# Patient Record
Sex: Female | Born: 1955 | Race: White | Hispanic: No | Marital: Married | Smoking: Never smoker
Health system: Southern US, Community
[De-identification: ages and names within clinical notes are randomized; demographics above are authoritative.]

## PROBLEM LIST (undated history)

## (undated) DIAGNOSIS — F419 Anxiety disorder, unspecified: Secondary | ICD-10-CM

## (undated) DIAGNOSIS — R269 Unspecified abnormalities of gait and mobility: Secondary | ICD-10-CM

## (undated) DIAGNOSIS — T8859XA Other complications of anesthesia, initial encounter: Secondary | ICD-10-CM

## (undated) DIAGNOSIS — E7800 Pure hypercholesterolemia, unspecified: Secondary | ICD-10-CM

## (undated) DIAGNOSIS — T4145XA Adverse effect of unspecified anesthetic, initial encounter: Secondary | ICD-10-CM

## (undated) DIAGNOSIS — F0280 Dementia in other diseases classified elsewhere without behavioral disturbance: Secondary | ICD-10-CM

## (undated) DIAGNOSIS — G3101 Pick's disease: Principal | ICD-10-CM

## (undated) DIAGNOSIS — K5792 Diverticulitis of intestine, part unspecified, without perforation or abscess without bleeding: Secondary | ICD-10-CM

## (undated) DIAGNOSIS — C801 Malignant (primary) neoplasm, unspecified: Secondary | ICD-10-CM

## (undated) HISTORY — PX: COLONOSCOPY: SHX174

## (undated) HISTORY — DX: Unspecified abnormalities of gait and mobility: R26.9

## (undated) HISTORY — DX: Pick's disease: G31.01

## (undated) HISTORY — DX: Dementia in other diseases classified elsewhere, unspecified severity, without behavioral disturbance, psychotic disturbance, mood disturbance, and anxiety: F02.80

## (undated) HISTORY — PX: BREAST BIOPSY: SHX20

## (undated) HISTORY — DX: Pure hypercholesterolemia, unspecified: E78.00

## (undated) HISTORY — DX: Malignant (primary) neoplasm, unspecified: C80.1

---

## 1985-12-25 DIAGNOSIS — C801 Malignant (primary) neoplasm, unspecified: Secondary | ICD-10-CM

## 1985-12-25 HISTORY — PX: BREAST LUMPECTOMY: SHX2

## 1985-12-25 HISTORY — DX: Malignant (primary) neoplasm, unspecified: C80.1

## 2000-03-22 ENCOUNTER — Encounter: Payer: Self-pay | Admitting: *Deleted

## 2000-03-22 ENCOUNTER — Encounter: Admission: RE | Admit: 2000-03-22 | Discharge: 2000-03-22 | Payer: Self-pay | Admitting: *Deleted

## 2000-09-04 ENCOUNTER — Emergency Department (HOSPITAL_COMMUNITY): Admission: EM | Admit: 2000-09-04 | Discharge: 2000-09-04 | Payer: Self-pay | Admitting: Emergency Medicine

## 2002-02-12 ENCOUNTER — Other Ambulatory Visit: Admission: RE | Admit: 2002-02-12 | Discharge: 2002-02-12 | Payer: Self-pay | Admitting: Internal Medicine

## 2002-03-26 ENCOUNTER — Encounter: Admission: RE | Admit: 2002-03-26 | Discharge: 2002-03-26 | Payer: Self-pay | Admitting: Internal Medicine

## 2002-03-26 ENCOUNTER — Encounter: Payer: Self-pay | Admitting: Internal Medicine

## 2002-05-09 ENCOUNTER — Encounter: Admission: RE | Admit: 2002-05-09 | Discharge: 2002-05-09 | Payer: Self-pay | Admitting: Internal Medicine

## 2002-05-09 ENCOUNTER — Encounter: Payer: Self-pay | Admitting: Internal Medicine

## 2003-05-11 ENCOUNTER — Encounter: Payer: Self-pay | Admitting: Internal Medicine

## 2003-05-11 ENCOUNTER — Encounter: Admission: RE | Admit: 2003-05-11 | Discharge: 2003-05-11 | Payer: Self-pay | Admitting: Internal Medicine

## 2003-05-18 ENCOUNTER — Other Ambulatory Visit: Admission: RE | Admit: 2003-05-18 | Discharge: 2003-05-18 | Payer: Self-pay | Admitting: Obstetrics & Gynecology

## 2005-03-20 ENCOUNTER — Encounter: Admission: RE | Admit: 2005-03-20 | Discharge: 2005-03-20 | Payer: Self-pay | Admitting: Obstetrics & Gynecology

## 2006-06-11 ENCOUNTER — Encounter: Admission: RE | Admit: 2006-06-11 | Discharge: 2006-06-11 | Payer: Self-pay | Admitting: Internal Medicine

## 2006-06-20 ENCOUNTER — Encounter: Admission: RE | Admit: 2006-06-20 | Discharge: 2006-06-20 | Payer: Self-pay | Admitting: Internal Medicine

## 2006-12-31 ENCOUNTER — Encounter: Admission: RE | Admit: 2006-12-31 | Discharge: 2006-12-31 | Payer: Self-pay | Admitting: Obstetrics & Gynecology

## 2007-07-10 ENCOUNTER — Encounter: Admission: RE | Admit: 2007-07-10 | Discharge: 2007-07-10 | Payer: Self-pay | Admitting: Internal Medicine

## 2008-09-23 ENCOUNTER — Encounter: Admission: RE | Admit: 2008-09-23 | Discharge: 2008-09-23 | Payer: Self-pay | Admitting: Internal Medicine

## 2009-08-21 ENCOUNTER — Emergency Department (HOSPITAL_COMMUNITY): Admission: EM | Admit: 2009-08-21 | Discharge: 2009-08-21 | Payer: Self-pay | Admitting: Emergency Medicine

## 2009-10-19 ENCOUNTER — Encounter: Admission: RE | Admit: 2009-10-19 | Discharge: 2009-10-19 | Payer: Self-pay | Admitting: Internal Medicine

## 2010-09-23 ENCOUNTER — Ambulatory Visit: Payer: Self-pay | Admitting: Genetic Counselor

## 2010-10-26 ENCOUNTER — Encounter: Admission: RE | Admit: 2010-10-26 | Discharge: 2010-10-26 | Payer: Self-pay | Admitting: Obstetrics & Gynecology

## 2011-01-15 ENCOUNTER — Encounter: Payer: Self-pay | Admitting: Obstetrics & Gynecology

## 2011-01-15 ENCOUNTER — Encounter: Payer: Self-pay | Admitting: Internal Medicine

## 2012-02-12 ENCOUNTER — Other Ambulatory Visit: Payer: Self-pay | Admitting: Obstetrics & Gynecology

## 2012-02-12 DIAGNOSIS — Z1231 Encounter for screening mammogram for malignant neoplasm of breast: Secondary | ICD-10-CM

## 2012-02-22 ENCOUNTER — Ambulatory Visit
Admission: RE | Admit: 2012-02-22 | Discharge: 2012-02-22 | Disposition: A | Discharge status: Home / Self Care | Payer: 59 | Source: Ambulatory Visit | Attending: Obstetrics & Gynecology | Admitting: Obstetrics & Gynecology

## 2012-02-22 DIAGNOSIS — Z1231 Encounter for screening mammogram for malignant neoplasm of breast: Secondary | ICD-10-CM

## 2013-03-27 ENCOUNTER — Other Ambulatory Visit: Payer: Self-pay

## 2013-03-27 DIAGNOSIS — Z1231 Encounter for screening mammogram for malignant neoplasm of breast: Secondary | ICD-10-CM

## 2013-04-07 ENCOUNTER — Other Ambulatory Visit: Payer: Self-pay | Admitting: Internal Medicine

## 2013-04-07 DIAGNOSIS — R42 Dizziness and giddiness: Secondary | ICD-10-CM

## 2013-04-07 DIAGNOSIS — R4781 Slurred speech: Secondary | ICD-10-CM

## 2013-04-08 ENCOUNTER — Ambulatory Visit
Admission: RE | Admit: 2013-04-08 | Discharge: 2013-04-08 | Disposition: A | Discharge status: Home / Self Care | Payer: 59 | Source: Ambulatory Visit | Attending: Internal Medicine | Admitting: Internal Medicine

## 2013-04-08 DIAGNOSIS — R42 Dizziness and giddiness: Secondary | ICD-10-CM

## 2013-04-08 DIAGNOSIS — R4781 Slurred speech: Secondary | ICD-10-CM

## 2013-05-07 ENCOUNTER — Ambulatory Visit
Admission: RE | Admit: 2013-05-07 | Discharge: 2013-05-07 | Disposition: A | Discharge status: Home / Self Care | Payer: 59 | Source: Ambulatory Visit

## 2013-05-07 DIAGNOSIS — Z1231 Encounter for screening mammogram for malignant neoplasm of breast: Secondary | ICD-10-CM

## 2013-06-24 ENCOUNTER — Ambulatory Visit (INDEPENDENT_AMBULATORY_CARE_PROVIDER_SITE_OTHER): Payer: 59 | Admitting: Diagnostic Neuroimaging

## 2013-06-24 ENCOUNTER — Encounter: Payer: Self-pay | Admitting: Diagnostic Neuroimaging

## 2013-06-24 VITALS — BP 126/89 | HR 80 | Ht 66.5 in | Wt 204.0 lb

## 2013-06-24 DIAGNOSIS — IMO0002 Reserved for concepts with insufficient information to code with codable children: Secondary | ICD-10-CM

## 2013-06-24 DIAGNOSIS — F809 Developmental disorder of speech and language, unspecified: Secondary | ICD-10-CM

## 2013-06-24 NOTE — Patient Instructions (Signed)
Observation. Follow up with PCP re: anxiety and sleep.

## 2013-06-24 NOTE — Progress Notes (Signed)
[facility]  PATIENT: Ann Wright DOB: Jul 04, 1956  REFERRING CLINICIAN: Lavoie HISTORY FROM: patient REASON FOR VISIT: new consult   HISTORICAL  CHIEF COMPLAINT:  Chief Complaint  Patient presents with  . Neurologic Problem    NP#6    HISTORY OF PRESENT ILLNESS:   57 year old right-handed female with history of breast cancer in 1987, status post lumpectomy, radiation and chemotherapy, hypercholesterolemia, here for evaluation of speech and language difficulty since April 2014. Patient reports intermittent difficulty with finding the right word, pronouncing the words correctly, reading and comprehending. Patient had MRI of the brain in April which was unremarkable. Since that time her symptoms have gradually improved. Patient does note symptoms even today. Symptoms are mainly noted by the patient and her daughter. Patient's husband and other people have not noted significant  Abnormalities.  She denies any problems with her lips, tongue, swallowing, drinking, vision, arms or legs.  REVIEW OF SYSTEMS: Full 14 system review of systems performed and notable only for ringing in ears snoring anxiety slurred speech poor sleep. She sleeps for about 6 hours per night but wakes up after 2-3 hours of sleep. She may take intermittent naps during the daytime.  ALLERGIES: No Known Allergies  HOME MEDICATIONS: No outpatient prescriptions prior to visit.   No facility-administered medications prior to visit.    PAST MEDICAL HISTORY: Past Medical History  Diagnosis Date  . High cholesterol   . Cancer 1987    Breast     PAST SURGICAL HISTORY: Past Surgical History  Procedure Laterality Date  . Breast biopsy    . Breast lumpectomy      FAMILY HISTORY: Family History  Problem Relation Age of Onset  . Heart attack Mother     SOCIAL HISTORY:  History   Social History  . Marital Status: Married    Spouse Name: N/A    Number of Children: 2  . Years of  Education: 12   Occupational History  . retired    Social History Main Topics  . Smoking status: Never Smoker   . Smokeless tobacco: Not on file  . Alcohol Use: Yes  . Drug Use: No  . Sexually Active: Not on file   Other Topics Concern  . Not on file   Social History Narrative  . No narrative on file     PHYSICAL EXAM  Filed Vitals:   06/24/13 0916  BP: 126/89  Pulse: 80  Height: 5' 6.5" (1.689 m)  Weight: 204 lb (92.534 kg)    Not recorded    Body mass index is 32.44 kg/(m^2).  GENERAL EXAM: Patient is in no distress  CARDIOVASCULAR: Regular rate and rhythm, no murmurs, no carotid bruits  NEUROLOGIC: MENTAL STATUS: awake, alert, language fluent, comprehension intact, naming intact CRANIAL NERVE: no papilledema on fundoscopic exam, pupils equal and reactive to light, visual fields full to confrontation, extraocular muscles intact, no nystagmus, facial sensation and strength symmetric, uvula midline, shoulder shrug symmetric, tongue midline. MOTOR: normal bulk and tone, full strength in the BUE, BLE SENSORY: normal and symmetric to light touch, pinprick, temperature, vibration and proprioception COORDINATION: finger-nose-finger, fine finger movements normal REFLEXES: deep tendon reflexes present and symmetric GAIT/STATION: narrow based gait; able to walk on toes, heels and tandem; romberg is negative   DIAGNOSTIC DATA (LABS, IMAGING, TESTING) - I reviewed patient records, labs, notes, testing and imaging myself where available.  No results found for this basename: WBC, HGB, HCT, MCV, PLT   No results found for this  basename: na, k, cl, co2, glucose, bun, creatinine, calcium, prot, albumin, ast, alt, alkphos, bilitot, gfrnonaa, gfraa   No results found for this basename: CHOL, HDL, LDLCALC, LDLDIRECT, TRIG, CHOLHDL   No results found for this basename: HGBA1C   No results found for this basename: VITAMINB12   No results found for this basename: TSH     04/08/13 MRI brain - normal    ASSESSMENT AND PLAN  57 y.o. year old female  has a past medical history of High cholesterol and Cancer (1987). here with intermittent speech and language difficulties. MRI brain is normal. Neurologic examination is normal. Symptoms are spontaneously improving.   I do not have a clear neurologic explanation for her symptoms at this time. Of note, as a result of these symptoms patient has developed significant anxiety. She also has some sleep issues. Hopefully these will improve over time with reassurance. If they do not, treatment may be advised per PCP.   Suanne Marker, MD 06/24/2013, 10:16 AM Certified in Neurology, Neurophysiology and Neuroimaging  [facility] [address], [address] [place], [place] [address] [phone]

## 2014-01-09 ENCOUNTER — Ambulatory Visit: Payer: 59 | Admitting: Diagnostic Neuroimaging

## 2014-02-26 ENCOUNTER — Encounter: Payer: Self-pay | Admitting: Diagnostic Neuroimaging

## 2014-02-26 ENCOUNTER — Ambulatory Visit (INDEPENDENT_AMBULATORY_CARE_PROVIDER_SITE_OTHER): Payer: 59 | Admitting: Diagnostic Neuroimaging

## 2014-02-26 VITALS — BP 132/86 | HR 85 | Ht 67.25 in | Wt 213.0 lb

## 2014-02-26 DIAGNOSIS — IMO0002 Reserved for concepts with insufficient information to code with codable children: Secondary | ICD-10-CM

## 2014-02-26 DIAGNOSIS — F809 Developmental disorder of speech and language, unspecified: Secondary | ICD-10-CM

## 2014-02-26 DIAGNOSIS — F411 Generalized anxiety disorder: Secondary | ICD-10-CM

## 2014-02-26 NOTE — Patient Instructions (Signed)
Start vigorous exercise program, sign up for a class to learn something new, increase social activities.

## 2014-02-26 NOTE — Progress Notes (Signed)
[facility]  PATIENT: Ann Wright DOB: Oct 14, 1956  REFERRING CLINICIAN:  HISTORY FROM: patient REASON FOR VISIT: follow up   HISTORICAL  CHIEF COMPLAINT:  Chief Complaint  Patient presents with  . Follow-up    6 mo, per instructions, Rm 6    HISTORY OF PRESENT ILLNESS:   UPDATE 02/26/14: Since last visit, was doing better, but then gradually declined with more language/word mix ups and mispronunciations. Now husband and daughter note some difficulty. Now more anxiety and worry about her symptoms. No patient reports she retired in 2011, previously a Equities trader for [employer] She previously was very active, interviewing people on a daily basis. Now that she does not spend much time talking. She does not spend a time with other people other than her husband. She likes to play golf, walk and garden. She's had decreased physical activity over the winter time. Now she is more socially withdrawn as a result of being self-conscious about her language problem. She still is concerned about something more ominous such as a neurodegenerative condition.  PRIOR HPI (06/24/13): 58 year old right-handed female with history of breast cancer in 1987, status post lumpectomy, radiation and chemotherapy, hypercholesterolemia, here for evaluation of speech and language difficulty since April 2014. Patient reports intermittent difficulty with finding the right word, pronouncing the words correctly, reading and comprehending. Patient had MRI of the brain in April which was unremarkable. Since that time her symptoms have gradually improved. Patient does note symptoms even today. Symptoms are mainly noted by the patient and her daughter. Patient's husband and other people have not noted significant  Abnormalities.  She denies any problems with her lips, tongue, swallowing, drinking, vision, arms or legs.  REVIEW OF SYSTEMS: Full 14 system review of systems performed and notable  only for ringing in ears snoring sleep talking speech difficulty.   ALLERGIES: No Known Allergies  HOME MEDICATIONS: Outpatient Prescriptions Prior to Visit  Medication Sig Dispense Refill  . atorvastatin (LIPITOR) 20 MG tablet Take 20 mg by mouth daily.       No facility-administered medications prior to visit.    PAST MEDICAL HISTORY: Past Medical History  Diagnosis Date  . High cholesterol   . Cancer 1987    Breast     PAST SURGICAL HISTORY: Past Surgical History  Procedure Laterality Date  . Breast biopsy    . Breast lumpectomy      FAMILY HISTORY: Family History  Problem Relation Age of Onset  . Heart attack Mother     SOCIAL HISTORY:  History   Social History  . Marital Status: Married    Spouse Name: michael    Number of Children: 2  . Years of Education: 12   Occupational History  . retired    Social History Main Topics  . Smoking status: Never Smoker   . Smokeless tobacco: Not on file  . Alcohol Use: Yes  . Drug Use: No  . Sexual Activity: Not on file   Other Topics Concern  . Not on file   Social History Narrative  . No narrative on file     PHYSICAL EXAM  Filed Vitals:   02/26/14 1318  BP: 132/86  Pulse: 85  Height: 5' 7.25" (1.708 m)  Weight: 213 lb (96.616 kg)    Not recorded    Body mass index is 33.12 kg/(m^2).  GENERAL EXAM: Patient is in no distress; NERVOUS APPEARING. TEARFUL WHEN EXPLAINING HER SYMPTOMS.  CARDIOVASCULAR: Regular rate and rhythm, no murmurs,  no carotid bruits  NEUROLOGIC: MENTAL STATUS: awake, alert, language fluent, comprehension intact, naming intact; NO FRONTAL RELEASE SIGNS. CRANIAL NERVE: no papilledema on fundoscopic exam, pupils equal and reactive to light, visual fields full to confrontation, extraocular muscles intact, no nystagmus, facial sensation and strength symmetric, uvula midline, shoulder shrug symmetric, tongue midline. MOTOR: normal bulk and tone, full strength in the BUE,  BLE SENSORY: normal and symmetric to light touch, pinprick, temperature, vibration COORDINATION: finger-nose-finger, fine finger movements normal REFLEXES: deep tendon reflexes present and symmetric GAIT/STATION: narrow based gait; able to walk tandem; romberg is negative   DIAGNOSTIC DATA (LABS, IMAGING, TESTING) - I reviewed patient records, labs, notes, testing and imaging myself where available.  No results found for this basename: WBC,  HGB,  HCT,  MCV,  PLT   No results found for this basename: na,  k,  cl,  co2,  glucose,  bun,  creatinine,  calcium,  prot,  albumin,  ast,  alt,  alkphos,  bilitot,  gfrnonaa,  gfraa   No results found for this basename: CHOL,  HDL,  LDLCALC,  LDLDIRECT,  TRIG,  CHOLHDL   No results found for this basename: HGBA1C   No results found for this basename: VITAMINB12   No results found for this basename: TSH    04/08/13 MRI brain - normal    ASSESSMENT AND PLAN  58 y.o. year old female  has a past medical history of High cholesterol and Cancer (1987). here with intermittent speech and language difficulties. MRI brain is normal. Neurologic examination is normal. Symptoms are fluctuating. Now with significant anxiety overlay. I do not have a clear neurologic explanation for her symptoms at this time. Of note, she has developed significant anxiety in reaction to her symptoms.   PLAN: 1. Holistic treatment of anxiety (start vigorous exercise program, sign up for a class to learn something new, increase social activities) 2. Consider referral to psychiatry/psychology if anxiety/depression does not improve over next 2-3 months  Return if symptoms worsen or fail to improve, for return to PCP.     Penni Bombard, MD 09/01/9891, 1:19 PM Certified in Neurology, Neurophysiology and Neuroimaging  [facility] [address] [place], [place] [address] [phone]

## 2014-05-08 DIAGNOSIS — F419 Anxiety disorder, unspecified: Secondary | ICD-10-CM | POA: Insufficient documentation

## 2014-05-08 DIAGNOSIS — R4701 Aphasia: Secondary | ICD-10-CM | POA: Insufficient documentation

## 2014-05-08 DIAGNOSIS — R471 Dysarthria and anarthria: Secondary | ICD-10-CM | POA: Insufficient documentation

## 2014-05-08 DIAGNOSIS — F8081 Childhood onset fluency disorder: Secondary | ICD-10-CM | POA: Insufficient documentation

## 2014-07-20 ENCOUNTER — Other Ambulatory Visit: Payer: Self-pay

## 2014-07-20 DIAGNOSIS — Z1231 Encounter for screening mammogram for malignant neoplasm of breast: Secondary | ICD-10-CM

## 2014-08-04 ENCOUNTER — Encounter (INDEPENDENT_AMBULATORY_CARE_PROVIDER_SITE_OTHER): Payer: Self-pay

## 2014-08-04 ENCOUNTER — Ambulatory Visit
Admission: RE | Admit: 2014-08-04 | Discharge: 2014-08-04 | Disposition: A | Discharge status: Home / Self Care | Payer: 59 | Source: Ambulatory Visit

## 2014-08-04 DIAGNOSIS — Z1231 Encounter for screening mammogram for malignant neoplasm of breast: Secondary | ICD-10-CM

## 2015-07-19 ENCOUNTER — Telehealth: Payer: Self-pay | Admitting: Diagnostic Neuroimaging

## 2015-07-19 NOTE — Telephone Encounter (Signed)
ERROR

## 2015-08-09 ENCOUNTER — Ambulatory Visit (INDEPENDENT_AMBULATORY_CARE_PROVIDER_SITE_OTHER): Payer: 59 | Admitting: Neurology

## 2015-08-09 ENCOUNTER — Encounter: Payer: Self-pay | Admitting: Neurology

## 2015-08-09 VITALS — BP 147/100 | HR 97 | Ht 67.0 in | Wt 203.5 lb

## 2015-08-09 DIAGNOSIS — G3101 Pick's disease: Secondary | ICD-10-CM

## 2015-08-09 DIAGNOSIS — F0280 Dementia in other diseases classified elsewhere without behavioral disturbance: Secondary | ICD-10-CM | POA: Diagnosis not present

## 2015-08-09 NOTE — Patient Instructions (Signed)
  We will set you up for a speech therapy evaluation. We will follow up in several months.   Aphasia Aphasia is a neurological disorder caused by damage to the parts of the brain that control language. CAUSES  Aphasia is not a disease, but a symptom of brain damage. Aphasia is commonly seen in adults who have suffered a stroke. Aphasia also can result from:  A brain tumor.  Infection.  Head injury.  A rare type of dementia called Primary Progressive Aphasia. Common types of dementia may be associated with aphasia but can also exist without language problems. SYMPTOMS  Primary signs of the disorder include:  Problems expressing oneself when speaking.  Trouble understanding speech.  Difficulty with reading and writing.  Speaking in short or incomplete sentences.  Speaking in sentences that don't make sense.  Speaking unrecognizable words.  Interpreting figurative language literally.  Writing sentences that don't make sense. The type and severity of language problems depend on the precise location and extent of the damaged brain tissue. Aphasia can be divided into four broad categories:  Expressive aphasia - difficulty in conveying thoughts through speech or writing. The patient knows what they want to say, but cannot find the words they need.  Receptive aphasia - difficulty understanding spoken or written language. The patient hears the voice or sees the print but cannot make sense of the words.  Anomic or amnesia aphasia - difficulty in using the correct names for particular objects, people, places, or events. This is the least severe form of aphasia.  Global aphasia results from severe and extensive damage to the language areas of the brain. Patients lose almost all language function, both comprehension (understanding) and expression. They cannot speak, understand speech, read, or write. TREATMENT  Sometimes an individual will completely recover from aphasia without  treatment. In most cases, language therapy should begin as soon as possible. Language therapy should be tailored to the individual needs of the patient. Therapy with a speech pathologist involves exercises in which patients:  Read.  Write.  Follow directions.  Repeat what they hear.  Computer-aided therapy may also be used. PROGNOSIS  The outcome of aphasia is difficult to predict. People who are younger or have less extensive brain damage do better. The location of the injury is also important. The location is a clue to prognosis. In general, patients tend to recover skills in language comprehension (understanding) more completely than those skills involving expression (speaking or writing). Document Released: 09/02/2002 Document Revised: 03/04/2012 Document Reviewed: 03/02/2014 [facility] Patient Information 2015 [place], [place]. This information is not intended to replace advice given to you by your health care provider. Make sure you discuss any questions you have with your health care provider.

## 2015-08-09 NOTE — Progress Notes (Signed)
Reason for visit: Primary progressive aphasia  Referring physician: Dr. Lorine Wright is a 59 y.o. female  History of present illness:  Ann Wright is a 59 year old right-handed white female with a two-year history of progressive aphasia. The patient has had MRI evaluation done in 2014, and again in 2015, the studies were unremarkable. 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 An EEG study was unremarkable. The patient mainly has issues with generating language, not understanding language. She indicates that she is able to read a newspaper. With speech, she has difficulty with fluent speech, often has a monosyllabic speech pattern. She denies any other issues such as headache, vision changes, swallowing problems, numbness or weakness of the extremities, balance problems, or difficulty controlling the bowels or the bladder. The patient denies any memory issues. She comes to this office for further evaluation.  Past Medical History  Diagnosis Date  . High cholesterol   . 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 Primary progressive aphasia     Past Surgical History  Procedure Laterality Date  . Breast biopsy    . Breast lumpectomy      Family History  Problem Relation Age of Onset  . Heart attack Mother   . Pneumonia Father   . Cancer Sister     breast  . Cancer Sister     breast    Social history:  reports that she has never smoked. She has never used smokeless tobacco. She reports that she does not drink alcohol or use illicit drugs.  Medications:  Prior to Admission medications   Medication Sig Start Date End Date Taking? Authorizing Provider  atorvastatin (LIPITOR) 20 MG tablet Take 20 mg by mouth daily.   Yes Historical Provider, MD  Cholecalciferol (VITAMIN D) 2000 UNITS CAPS Take 1 capsule by mouth. Patient takes every other day.   Yes Historical Provider, MD     No Known Allergies  ROS:  Out of a complete 14 system review  of symptoms, the patient complains only of the following symptoms, and all other reviewed systems are negative.  Slurred speech  Blood pressure 147/100, pulse 97, height 5\' 7"  (1.702 m), weight 203 lb 8 oz (92.307 kg).  Physical Exam  General: The patient is alert and cooperative at the time of the examination. The patient is minimally obese.  Eyes: Pupils are equal, round, and reactive to light. Discs are flat bilaterally.  Neck: The neck is supple, no carotid bruits are noted.  Respiratory: The respiratory examination is clear.  Cardiovascular: The cardiovascular examination reveals a regular rate and rhythm, no obvious murmurs or rubs are noted.  Skin: Extremities are without significant edema.  Neurologic Exam  Mental status: The patient is alert and oriented x 3 at the time of the examination. The patient has apparent normal recent and remote memory, with an apparently normal attention span and concentration ability.  Cranial nerves: Facial symmetry is present. There is good sensation of the face to pinprick and soft touch bilaterally. The strength of the facial muscles and the muscles to head turning and shoulder shrug are normal bilaterally. Speech is aphasic, slightly nonfluent, the patient is able to understand speech well. Extraocular movements are full. Visual fields are full. The tongue is midline, and the patient has symmetric elevation of the soft palate. No obvious hearing deficits are noted.  Motor: The motor testing reveals 5 over 5 strength of all 4 extremities. Good  symmetric motor tone is noted throughout.  Sensory: Sensory testing is intact to pinprick, soft touch, vibration sensation, and position sense on all 4 extremities. No evidence of extinction is noted.  Coordination: Cerebellar testing reveals good finger-nose-finger and heel-to-shin bilaterally.  Gait and station: Gait is normal. Tandem gait is normal. Romberg is negative. No drift is seen.  Reflexes:  Deep tendon reflexes are symmetric and normal bilaterally. Toes are downgoing bilaterally.   Assessment/Plan:  1. Primary progressive aphasia  The patient has a clinical history fully consistent with primary progressive aphasia. 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 Unfortunately, this is not treatable, medications such as Aricept or Exelon offered no benefit. I will set the patient up for speech therapy evaluation, follow-up in 6-8 months. Eventually, the patient will lose the ability to understand and generate language, and may develop a memory issue in the future as well. The progression appears to be relative a slow, however.  Ann Alexanders MD 08/09/2015 7:36 PM  [facility] [address] [place], [place] [address]  Phone [phone] Fax [phone]

## 2015-08-19 ENCOUNTER — Telehealth: Payer: Self-pay | Admitting: Neurology

## 2015-08-19 NOTE — Telephone Encounter (Signed)
Patient's husband is calling regarding the patient. 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 It showed benefits in all cases. Please call to discuss a TDCS device. Thank you.

## 2015-08-19 NOTE — Telephone Encounter (Signed)
I called the husband. Apparently the study trial at [facility] has artery been completed, using transcranial stimulation. I have asked him to contact the research department, and see if there are any other trials in the nation ongoing using a similar device, or other trials involving PPA

## 2015-09-07 ENCOUNTER — Ambulatory Visit: Payer: 59 | Attending: Neurology | Admitting: Speech Pathology

## 2015-09-07 DIAGNOSIS — G3101 Pick's disease: Secondary | ICD-10-CM | POA: Diagnosis not present

## 2015-09-07 DIAGNOSIS — F0280 Dementia in other diseases classified elsewhere without behavioral disturbance: Secondary | ICD-10-CM | POA: Insufficient documentation

## 2015-09-07 NOTE — Patient Instructions (Signed)
Tips for Talking with People who have Aphasia  . Say one thing at a time . Don't  rush - slow down, be patient . Reduce background noise . Relax - be natural . Use pen and paper . Write down key words . Draw diagrams or pictures . Don't pretend you understand . Ask what helps . Recap - check you both understand   Describing words  What group does it belong to?  What do I use it for?  Where can I find it?  What does it LOOK like?  What other words go with it?  What is the 1st sound of the word?  Many Ways to Communicate  Describe it Write it Draw it Gesture it Use related words  There's an App for that: Family Feud, Heads up, Stop-fun categories, What if, Conversation TherAPPy  Constant Therapy  Provided by: Laura L. ST, [phone]   

## 2015-09-07 NOTE — Therapy (Signed)
[facility] [address] [place], [place], [address] Phone: [phone]   Fax:  [phone]  Speech Language Pathology Evaluation  Patient Details  Name: Ann Wright MRN: [id number] Date of Birth: Jul 12, 1956 Referring Provider:  Kathrynn Ducking, MD  Encounter Date: 09/07/2015      End of Session - 09/07/15 1157    Visit Number 1   Number of Visits 16   Date for SLP Re-Evaluation 11/02/15   SLP Start Time 2   SLP Stop Time  1146   SLP Time Calculation (min) 46 min   Activity Tolerance Patient tolerated treatment well      Past Medical History  Diagnosis Date  . High cholesterol   . 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 Primary progressive aphasia     Past Surgical History  Procedure Laterality Date  . Breast biopsy    . Breast lumpectomy      There were no vitals filed for this visit.  Visit Diagnosis: Primary progressive aphasia - Plan: SLP plan of care cert/re-cert      Subjective Assessment - 09/07/15 1107    Subjective Pt tearful and crying as she entered the room - "I am doing computer sheets they gave me at [facility]"            SLP Evaluation [facility] - 09/07/15 1108    SLP Visit Information   SLP Received On 09/07/15   Onset Date 2015 At [place] Diagnosis Primary Progressive Aphasia   Subjective   Patient/Family Stated Goal Maximize language skills - help me get the words out   General Information   Mobility Status No falls - walks indepedently   Prior Functional Status   Cognitive/Linguistic Baseline Baseline deficits   Baseline deficit details Pt noticed PPA symptoms about 2 1/2 years ago   Type of Home House    Lives With Spouse   Available Support Family;Friend(s)   Vocation Retired   Associate Professor   Overall Cognitive Status Difficult to assess   Difficult to assess due to --  language - progressive aphasia   Auditory Comprehension   Yes/No Questions Within Functional Limits    Commands Within Functional Limits   Conversation Complex   Reading Comprehension   Reading Status Within funtional limits   Expression   Primary Mode of Expression Verbal   Verbal Expression   Overall Verbal Expression Impaired   Initiation No impairment   Automatic Speech Month of year  semantic paraphasias, aware and corrected   Level of Generative/Spontaneous Verbalization Sentence   Repetition Impaired   Level of Impairment Phrase level   Naming Impairment   Responsive 76-100% accurate   Confrontation 75-100% accurate   Convergent 75-100% accurate   Divergent 50-74% accurate   Other Naming Comments Anomia throughout conversation   Verbal Errors Phonemic paraphasias;Aware of errors   Pragmatics No impairment   Other Verbal Expression Comments phonemic paraphasias beneficial   Written Expression   Dominant Hand Right   Written Expression Within Functional Limits   Overall Writen Expression sentnece to dictation   Oral Motor/Sensory Function   Overall Oral Motor/Sensory Function Appears within functional limits for tasks assessed   Standardized Assessments   Standardized Assessments  [place] Naming Test-2nd edition                      ADULT SLP TREATMENT - 09/07/15 1218    Cognitive-Linquistic Treatment   Skilled Treatment Initiated training  in compensations for aphasia. Pt required usual mod A to demonstrate compensation in simple language task. Initiated instruction of multiodal communication skills with usual mod A           SLP Education - 09/07/15 1145    Education provided Yes   Education Details compensations for aphasia, goals in therapy           SLP Short Term Goals - 09/07/15 1211    SLP SHORT TERM GOAL #1   Title Pt will perform mildly complex naming tasks with 80% accuracy and occasional min verbal cues   Time 4   Period Weeks   Status New   SLP SHORT TERM GOAL #2   Title Pt will demonstrate compensations for aphasia during  structured language tasks with occasional min assistance   Time 4   Period Weeks   Status New   SLP SHORT TERM GOAL #3   Title Pt will report participating in language enhancing activities daily outside of therapy over 3 sessions   Time 4   Period Weeks   Status New          SLP Long Term Goals - 09/07/15 1214    SLP LONG TERM GOAL #1   Title Pt will utilize compensations for aphasia during simple 8 minute conversation with occasional min A   Time 8   Period Weeks   Status New   SLP LONG TERM GOAL #2   Title Pt will demonstrate multimodal communication (drawing/gestures, etc) in structured language tasks with 85% accuracy and occasional min A   Time 8   Period Weeks   Status New          Plan - 09/07/15 1159    Clinical Impression Statement Ann Wright, a 59 y.o. female is referred for speech therapy by neurologist due to primary progressive aphasia. Today she presents with mild to moderate non fluent aphasia with significan anomia at conversation level. Auditory comprehension is intact to complex yes/no questions and paragraph/conversation level. Automatic speech is relatively intact, with a few phonemic paraphasias with the months, which she independently corrected. Basic responsive and confrontation naming are intact. Pt named 7 items for a simple category in 1 minute, with 15 being [facility]. Ann Wright scored a 51/60 on the [place] , which is within the Standard Deviation  for WNL for her age. Semanitic cues were less beneficial than phonemic cues in facilitating word finding. Ann Wright demonsrated tearfulness throughout the evaluation. Her verbal expression at conversation level is non fluent. She demonstrates a hesitancy to answer my questions and to participate in conversation. Ann Wright reports she does avoid phone conversation , social interactions and friends due to PPA. I recommend skilled ST to maximize  verbal expression and train Ann Wright and her  family for compensations for aphasia as the diesease progresses.    Speech Therapy Frequency 2x / week   Duration --  8 weeks   Treatment/Interventions Compensatory strategies;Patient/family education;Multimodal communcation approach;Internal/external aids;SLP instruction and feedback;Language facilitation;Environmental controls   Potential to Achieve Goals Good   Consulted and Agree with Plan of Care Patient        Problem List Patient Active Problem List   Diagnosis Date Noted  . Primary progressive aphasia 08/09/2015    Ianmichael Amescua, Annye Rusk MS, CCC-SLP 09/07/2015, 12:22 PM  [facility] [address] [place], [place], [address] Phone: [phone]   Fax:  [phone]

## 2015-09-20 ENCOUNTER — Ambulatory Visit: Payer: 59 | Admitting: Speech Pathology

## 2015-09-20 DIAGNOSIS — F0280 Dementia in other diseases classified elsewhere without behavioral disturbance: Secondary | ICD-10-CM

## 2015-09-20 DIAGNOSIS — G3101 Pick's disease: Secondary | ICD-10-CM | POA: Diagnosis not present

## 2015-09-20 NOTE — Therapy (Signed)
[facility] [address] [place], [place], [address] Phone: [phone]   Fax:  [phone]  Speech Language Pathology Treatment  Patient Details  Name: Ann Wright MRN: [id number] Date of Birth: 23-Nov-1956 Referring Provider:  Wenda Low, MD  Encounter Date: 09/20/2015      End of Session - 09/20/15 1444    Visit Number 2   Number of Visits 16   Date for SLP Re-Evaluation 11/02/15   SLP Start Time 0934   SLP Stop Time  6962   SLP Time Calculation (min) 41 min      Past Medical History  Diagnosis Date  . High cholesterol   . 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 Primary progressive aphasia     Past Surgical History  Procedure Laterality Date  . Breast biopsy    . Breast lumpectomy      There were no vitals filed for this visit.  Visit Diagnosis: Primary progressive aphasia      Subjective Assessment - 09/20/15 0936    Subjective "It was fine, just a few were hard"   Currently in Pain? No/denies               ADULT SLP TREATMENT - 09/20/15 0937    General Information   Behavior/Cognition Alert;Cooperative;Pleasant mood   Treatment Provided   Treatment provided Cognitive-Linquistic   Pain Assessment   Pain Assessment No/denies pain   Cognitive-Linquistic Treatment   Treatment focused on Aphasia   Skilled Treatment Facilitated compensations with describing simple objects for ST to guess - to train descriptions as compensations with rare min cues/modeling. Pt perfromed conversent naming for simple basic items with mod I. Facilitated language flexibility with generating  multiple meaning sentneces - first meaning sentnece generated with mod I, sencond meaning sentence with rare min/semantic cues. Facilitatee naming in divergent naming tasks - pt named 8 to 10 with occasional min questioing cues.    Assessment / Recommendations / Plan   Plan Continue with current plan of care   Progression Toward  Goals   Progression toward goals Progressing toward goals          SLP Education - 09/20/15 1005    Education provided Yes   Education Details Compensations for aphasia - describing   Person(s) Educated Patient   Comprehension Verbalized understanding;Returned demonstration;Need further instruction          SLP Short Term Goals - 09/20/15 1443    SLP SHORT TERM GOAL #1   Title Pt will perform mildly complex naming tasks with 80% accuracy and occasional min verbal cues   Time 3   Period Weeks   Status New   SLP SHORT TERM GOAL #2   Title Pt will demonstrate compensations for aphasia during structured language tasks with occasional min assistance   Time 3   Period Weeks   Status New   SLP SHORT TERM GOAL #3   Title Pt will report participating in language enhancing activities daily outside of therapy over 3 sessions   Time 3   Period Weeks   Status New          SLP Long Term Goals - 09/20/15 1444    SLP LONG TERM GOAL #1   Title Pt will utilize compensations for aphasia during simple 8 minute conversation with occasional min A   Time 7   Period Weeks   Status On-going   SLP LONG TERM GOAL #2   Title Pt will demonstrate  multimodal communication (drawing/gestures, etc) in structured language tasks with 85% accuracy and occasional min A   Time 7   Period Weeks   Status On-going          Plan - 09/20/15 1007    Clinical Impression Statement Pt did quite well using descriptions in structured tasks with rare min A - simple conversation pt required occasional min A to utilize compensations for word finding errors. Continue skilled ST to maximize carryove of compensations for aphasia.        Problem List Patient Active Problem List   Diagnosis Date Noted  . Primary progressive aphasia 08/09/2015    Lovvorn, Annye Rusk MS, CCC-SLP 09/20/2015, 2:45 PM  [facility] [address] [place], [place],  [address] Phone: [phone]   Fax:  [phone]

## 2015-09-24 ENCOUNTER — Ambulatory Visit: Payer: 59

## 2015-09-28 ENCOUNTER — Encounter: Payer: 59 | Admitting: Speech Pathology

## 2015-09-29 ENCOUNTER — Ambulatory Visit: Payer: 59 | Attending: Neurology | Admitting: Speech Pathology

## 2015-09-29 DIAGNOSIS — G3101 Pick's disease: Secondary | ICD-10-CM | POA: Insufficient documentation

## 2015-09-29 DIAGNOSIS — F0280 Dementia in other diseases classified elsewhere without behavioral disturbance: Secondary | ICD-10-CM | POA: Diagnosis present

## 2015-09-29 NOTE — Therapy (Signed)
[facility] [address] [place], [place], [address] Phone: [phone]   Fax:  [phone]  Speech Language Pathology Treatment  Patient Details  Name: Ann Wright MRN: [id number] Date of Birth: 03-Dec-1956 Referring Provider:  Wenda Low, MD  Encounter Date: 09/29/2015      End of Session - 09/29/15 1426    Visit Number 3   Number of Visits 16   Date for SLP Re-Evaluation 11/02/15   SLP Start Time 17   SLP Stop Time  1140   SLP Time Calculation (min) 40 min   Activity Tolerance Patient tolerated treatment well      Past Medical History  Diagnosis Date  . High cholesterol   . 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 Primary progressive aphasia     Past Surgical History  Procedure Laterality Date  . Breast biopsy    . Breast lumpectomy      There were no vitals filed for this visit.  Visit Diagnosis: Primary progressive aphasia      Subjective Assessment - 09/29/15 1131    Subjective Spouse accompanied pt to therapy today!!               ADULT SLP TREATMENT - 09/29/15 1131    General Information   Behavior/Cognition Alert;Cooperative;Pleasant mood   Treatment Provided   Treatment provided Cognitive-Linquistic   Pain Assessment   Pain Assessment No/denies pain   Cognitive-Linquistic Treatment   Treatment focused on Aphasia   Skilled Treatment Trained compensations for aphasia with spouse, including drawing items on LARK  with 80% accuracy, trained in describing itmes for spouse and ST to guess with 90% accuracy and rare min questions for clarification. Spouse verbalized need to practice drawing,charades/gestures and describing due to the progressive nature of PPA. Lanauge faciliation with pt generating complex multiple meaning sentences witnhn occasional cues and 80% accuracy. Provided spouse information re: communication book ideas that may help in the future.    Assessment / Recommendations /  Plan   Plan Continue with current plan of care   Progression Toward Goals   Progression toward goals Progressing toward goals          SLP Education - 09/29/15 1423    Education provided Yes   Education Details non linguistic compensations for aphasia, need for communication book in future as disease progresses, compensatiions for present word finding episodes.    Person(s) Educated Patient;Spouse   Methods Explanation;Demonstration;Handout;Verbal cues   Comprehension Verbalized understanding;Returned demonstration;Need further instruction;Verbal cues required          SLP Short Term Goals - 09/29/15 1426    SLP SHORT TERM GOAL #1   Title Pt will perform mildly complex naming tasks with 80% accuracy and occasional min verbal cues   Time 2   Period Weeks   Status On-going   SLP SHORT TERM GOAL #2   Title Pt will demonstrate compensations for aphasia during structured language tasks with occasional min assistance   Time 2   Period Weeks   Status On-going   SLP SHORT TERM GOAL #3   Title Pt will report participating in language enhancing activities daily outside of therapy over 3 sessions   Time 2   Period Weeks   Status On-going          SLP Long Term Goals - 09/29/15 1426    SLP LONG TERM GOAL #1   Title Pt will utilize compensations for aphasia during simple 8 minute conversation with  occasional min A   Time 6   Period Weeks   Status On-going   SLP LONG TERM GOAL #2   Title Pt will demonstrate multimodal communication (drawing/gestures, etc) in structured language tasks with 85% accuracy and occasional min A   Time 6   Period Weeks   Status On-going          Plan - 09/29/15 1425    Clinical Impression Statement Continue skilled ST to maximize verbal expession, language skills and on going training for aphasia compensations for progression of aphasia. Requested spouse to attend sessions as he is able.   Speech Therapy Frequency 2x / week    Treatment/Interventions Compensatory strategies;Patient/family education;Multimodal communcation approach;Internal/external aids;SLP instruction and feedback;Language facilitation;Environmental controls   Potential to Achieve Goals Good   Potential Considerations Medical prognosis   Consulted and Agree with Plan of Care Patient;Family member/caregiver        Problem List Patient Active Problem List   Diagnosis Date Noted  . Primary progressive aphasia 08/09/2015    Derald Lorge, Annye Rusk MS, CCC-SLP 09/29/2015, 2:27 PM  [facility] [address] [place], [place], [address] Phone: [phone]   Fax:  [phone]

## 2015-09-29 NOTE — Patient Instructions (Signed)
  AMER-IND  Analogies  Practice pictionary, charardes, win loose or draw, heads up type game ( describing without timer)

## 2015-10-05 ENCOUNTER — Ambulatory Visit: Payer: 59 | Admitting: Speech Pathology

## 2015-10-05 DIAGNOSIS — F0280 Dementia in other diseases classified elsewhere without behavioral disturbance: Secondary | ICD-10-CM

## 2015-10-05 DIAGNOSIS — G3101 Pick's disease: Principal | ICD-10-CM

## 2015-10-05 NOTE — Therapy (Signed)
[facility] [address] [place], [place], [address] Phone: [phone]   Fax:  [phone]  Speech Language Pathology Treatment  Patient Details  Name: Ann Wright MRN: [id number] Date of Birth: [birth year] Referring Provider:  Wenda Low, MD  Encounter Date: 10/05/2015      End of Session - 10/05/15 1502    Visit Number 4   Number of Visits 16   Date for SLP Re-Evaluation 11/02/15   SLP Start Time 32   SLP Stop Time  1450   SLP Time Calculation (min) 48 min   Activity Tolerance Patient tolerated treatment well      Past Medical History  Diagnosis Date  . High cholesterol   . 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 Primary progressive aphasia     Past Surgical History  Procedure Laterality Date  . Breast biopsy    . Breast lumpectomy      There were no vitals filed for this visit.  Visit Diagnosis: Primary progressive aphasia      Subjective Assessment - 10/05/15 1407    Subjective "I completed the analogies - they were tought"               ADULT SLP TREATMENT - 10/05/15 1407    General Information   Behavior/Cognition Alert;Cooperative;Pleasant mood   Treatment Provided   Treatment provided Cognitive-Linquistic   Pain Assessment   Pain Assessment No/denies pain   Cognitive-Linquistic Treatment   Treatment focused on Aphasia   Skilled Treatment Facilitated verbal expression with pt generating 3 senteces for 2 given words - pt generated  2 senteces with supervision to rare min cues, however  she required extended time and semantic cues and cloze cues for 3rd sentecnces. I changed this to written to facilitate success as pt was becoming frustrated. Word association  with 10 words  with  occasional min cues.  Aphasia compensations of describing  simple random words for ST to guess with 90% accuracy and rare use of gestures. I encouraged pt to use gestures as well as verbal descriptions to augment  language.    Assessment / Recommendations / Plan   Plan Continue with current plan of care   Progression Toward Goals   Progression toward goals Progressing toward goals            SLP Short Term Goals - 10/05/15 1501    SLP SHORT TERM GOAL #1   Title Pt will perform mildly complex naming tasks with 80% accuracy and occasional min verbal cues   Time 1   Period Weeks   Status On-going   SLP SHORT TERM GOAL #2   Title Pt will demonstrate compensations for aphasia during structured language tasks with occasional min assistance   Time 1   Period Weeks   Status Achieved   SLP SHORT TERM GOAL #3   Title Pt will report participating in language enhancing activities daily outside of therapy over 3 sessions   Time 1   Period Weeks   Status On-going          SLP Long Term Goals - 10/05/15 1502    SLP LONG TERM GOAL #1   Title Pt will utilize compensations for aphasia during simple 8 minute conversation with occasional min A   Time 5   Period Weeks   Status On-going   SLP LONG TERM GOAL #2   Title Pt will demonstrate multimodal communication (drawing/gestures, etc) in structured language tasks with 85% accuracy  and occasional min A   Time 5   Period Weeks   Status On-going          Plan - 10/05/15 1457    Clinical Impression Statement Speech/language more halting with phonemic paraphsias today. Pt afirmmed this is worse thatn usual. Pt requried frequent mod cues to 3 setneces with 2 given words, became tearful. Continue skilled ST to maximize carryover of compensations for aphasia andto maximize current language  skills.    Treatment/Interventions Compensatory strategies;Patient/family education;Multimodal communcation approach;Internal/external aids;SLP instruction and feedback;Language facilitation;Environmental controls   Potential to Achieve Goals Good   Potential Considerations Medical prognosis   Consulted and Agree with Plan of Care Patient;Family member/caregiver         Problem List Patient Active Problem List   Diagnosis Date Noted  . Primary progressive aphasia 08/09/2015    Sevon Rotert, Annye Rusk MS, CCC-SLP 10/05/2015, 3:03 PM  [facility] [address] [place], [place], [address] Phone: [phone]   Fax:  [phone]

## 2015-10-07 ENCOUNTER — Encounter: Payer: 59 | Admitting: Speech Pathology

## 2015-10-12 ENCOUNTER — Encounter: Payer: 59 | Admitting: Speech Pathology

## 2015-10-14 ENCOUNTER — Encounter: Payer: 59 | Admitting: Speech Pathology

## 2015-10-19 ENCOUNTER — Encounter: Payer: 59 | Admitting: Speech Pathology

## 2015-10-21 ENCOUNTER — Encounter: Payer: 59 | Admitting: Speech Pathology

## 2015-10-26 ENCOUNTER — Ambulatory Visit: Payer: 59 | Attending: Neurology | Admitting: Speech Pathology

## 2015-10-26 DIAGNOSIS — G3101 Pick's disease: Secondary | ICD-10-CM | POA: Diagnosis present

## 2015-10-26 DIAGNOSIS — F0280 Dementia in other diseases classified elsewhere without behavioral disturbance: Secondary | ICD-10-CM | POA: Diagnosis present

## 2015-10-26 NOTE — Therapy (Signed)
[facility] [address] [place], [place], [address] Phone: [phone]   Fax:  [phone]  Speech Language Pathology Treatment  Patient Details  Name: Ann Wright MRN: [id number] Date of Birth: Nov 05, 1956 No Data Recorded  Encounter Date: 10/26/2015      End of Session - 10/26/15 1150    Visit Number 5   Number of Visits 16   SLP Start Time 1660   SLP Stop Time  6301   SLP Time Calculation (min) 41 min      Past Medical History  Diagnosis Date  . High cholesterol   . 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 Primary progressive aphasia     Past Surgical History  Procedure Laterality Date  . Breast biopsy    . Breast lumpectomy      There were no vitals filed for this visit.  Visit Diagnosis: Primary progressive aphasia      Subjective Assessment - 10/26/15 1117    Subjective "I didn't get it all completed - " re: homework packet.   Currently in Pain? No/denies               ADULT SLP TREATMENT - 10/26/15 1120    General Information   Behavior/Cognition Alert;Cooperative;Pleasant mood   Cognitive-Linquistic Treatment   Treatment focused on Aphasia   Skilled Treatment Trained aphasia compensations using gestures for simple objects with rare min cues, and drawing item in known category with 85% success. Facilitated word finding with describing similairities and differences with 90% accuracy. Simple conversation - 90% accuracy with occasional min questioning cues. .   Assessment / Recommendations / Plan   Plan Continue with current plan of care   Progression Toward Goals   Progression toward goals Progressing toward goals          SLP Education - 10/26/15 1144    Education provided Yes   Education Details strategies to augment verbal expression   Person(s) Educated Patient   Methods Explanation;Demonstration   Comprehension Verbalized understanding;Returned demonstration;Verbal cues  required;Need further instruction          SLP Short Term Goals - 10/26/15 1149    SLP SHORT TERM GOAL #1   Title Pt will perform mildly complex naming tasks with 80% accuracy and occasional min verbal cues   Time 1   Period Weeks   Status On-going   SLP SHORT TERM GOAL #2   Title Pt will demonstrate compensations for aphasia during structured language tasks with occasional min assistance   Time 1   Period Weeks   Status Achieved   SLP SHORT TERM GOAL #3   Title Pt will report participating in language enhancing activities daily outside of therapy over 3 sessions   Time 1   Period Weeks   Status Achieved          SLP Long Term Goals - 10/26/15 1150    SLP LONG TERM GOAL #1   Title Pt will utilize compensations for aphasia during simple 8 minute conversation with occasional min A   Time 4   Period Weeks   Status On-going   SLP LONG TERM GOAL #2   Title Pt will demonstrate multimodal communication (drawing/gestures, etc) in structured language tasks with 85% accuracy and occasional min A   Time 4   Period Weeks   Status On-going          Plan - 10/26/15 1147    Clinical Impression Statement Pt utlized drawing and  gestures to communicate simple objects and foods with 80% accuracy and min questioning cues. Verbal expression at simple conversation/open ended questions required encouragement to expand on her answers and idea - typically speaking at phrase level., Continue skilled ST to maximize carryover of compensations as disease progress and to  maximize current language/vebal expression skills.    Speech Therapy Frequency 2x / week   Treatment/Interventions Compensatory strategies;Patient/family education;Multimodal communcation approach;Internal/external aids;SLP instruction and feedback;Language facilitation;Environmental controls   Potential to Achieve Goals Good   Potential Considerations Medical prognosis   Consulted and Agree with Plan of Care Patient         Problem List Patient Active Problem List   Diagnosis Date Noted  . Primary progressive aphasia 08/09/2015    Lovvorn, Annye Rusk MS, CCC-SLP 10/26/2015, 11:51 AM  [facility] [address] [place], [place], [address] Phone: [phone]   Fax:  [phone]   Name: Jacqueli Pangallo MRN: [id number] Date of Birth: 27-Feb-1956

## 2015-10-28 ENCOUNTER — Ambulatory Visit: Payer: 59 | Admitting: Speech Pathology

## 2015-10-28 DIAGNOSIS — G3101 Pick's disease: Secondary | ICD-10-CM | POA: Diagnosis not present

## 2015-10-28 DIAGNOSIS — F0280 Dementia in other diseases classified elsewhere without behavioral disturbance: Secondary | ICD-10-CM

## 2015-10-28 DIAGNOSIS — Z0271 Encounter for disability determination: Secondary | ICD-10-CM

## 2015-10-28 NOTE — Therapy (Signed)
[facility] [address] [place], [place], [address] Phone: [phone]   Fax:  [phone]  Speech Language Pathology Treatment  Patient Details  Name: Orabelle Rylee MRN: [id number] Date of Birth: 25-Mar-1956 No Data Recorded  Encounter Date: 10/28/2015      End of Session - 10/28/15 1200    Visit Number 6   Date for SLP Re-Evaluation 11/02/15   SLP Start Time 1103   SLP Stop Time  1145   SLP Time Calculation (min) 42 min   Activity Tolerance Patient tolerated treatment well      Past Medical History  Diagnosis Date  . High cholesterol   . 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 Primary progressive aphasia     Past Surgical History  Procedure Laterality Date  . Breast biopsy    . Breast lumpectomy      There were no vitals filed for this visit.  Visit Diagnosis: Primary progressive aphasia      Subjective Assessment - 10/28/15 1107    Subjective "I did 2 pages of my homework packet"   Currently in Pain? No/denies               ADULT SLP TREATMENT - 10/28/15 1107    General Information   Behavior/Cognition Alert;Cooperative;Pleasant mood   Treatment Provided   Treatment provided Cognitive-Linquistic   Pain Assessment   Pain Assessment No/denies pain   Cognitive-Linquistic Treatment   Treatment focused on Aphasia   Skilled Treatment Facilitated high level verbal expression via having pt describe the meaning of common idioms  with usual mod cues, questioning cues  and extended time.  This abstract use of language was quite difficult for her. Mderately complex conversation facilitated by providing pros and con to controversial topics - pt vebalized her opinion, required usual min questioning cues to expand her ideas and to use specifics. Language vague with reduced content words. She also required usual mod A to generate ideas/arugments for  opposing views. Conversation halting - question soime motor  planning component as well as dysnomia.   Assessment / Recommendations / Plan   Plan Continue with current plan of care   Progression Toward Goals   Progression toward goals Progressing toward goals          SLP Education - 10/28/15 1158    Education provided Yes   Education Details stress, fatigue, illness can adversely affect speech and language   Person(s) Educated Patient   Methods Explanation   Comprehension Verbalized understanding          SLP Short Term Goals - 10/28/15 1159    SLP SHORT TERM GOAL #1   Title Pt will perform mildly complex naming tasks with 80% accuracy and occasional min verbal cues   Time 1   Period Weeks   Status On-going   SLP SHORT TERM GOAL #2   Title Pt will demonstrate compensations for aphasia during structured language tasks with occasional min assistance   Time 1   Period Weeks   Status Achieved   SLP SHORT TERM GOAL #3   Title Pt will report participating in language enhancing activities daily outside of therapy over 3 sessions   Time 1   Period Weeks   Status Achieved          SLP Long Term Goals - 10/28/15 1159    SLP LONG TERM GOAL #1   Title Pt will utilize compensations for aphasia during simple 8 minute conversation with  occasional min A   Time 4   Period Weeks   Status On-going   SLP LONG TERM GOAL #2   Title Pt will demonstrate multimodal communication (drawing/gestures, etc) in structured language tasks with 85% accuracy and occasional min A   Time 4   Period Weeks   Status On-going          Plan - 10/28/15 1154    Clinical Impression Statement Moderately complex conversation and abstract language required mod questioning cues and requests for pt to use content words rather than vague language. Encouraged her to keep talking in a variety of situations to maintain language. Pt reports avoiding people/situations due to her aphasia.   Speech Therapy Frequency 2x / week   Treatment/Interventions Compensatory  strategies;Patient/family education;Multimodal communcation approach;Internal/external aids;SLP instruction and feedback;Language facilitation;Environmental controls   Potential to Achieve Goals Good   Potential Considerations Medical prognosis   Consulted and Agree with Plan of Care Patient        Problem List Patient Active Problem List   Diagnosis Date Noted  . Primary progressive aphasia 08/09/2015    Hurman Ketelsen, Annye Rusk  MS, CCC-SLP  10/28/2015, 12:01 PM  [facility] [address] [place], [place], [address] Phone: [phone]   Fax:  [phone]   Name: Srishti Strnad MRN: [id number] Date of Birth: 1956-08-10

## 2015-11-02 ENCOUNTER — Ambulatory Visit: Payer: 59 | Admitting: Speech Pathology

## 2015-11-02 DIAGNOSIS — G3101 Pick's disease: Secondary | ICD-10-CM | POA: Diagnosis not present

## 2015-11-02 DIAGNOSIS — F0280 Dementia in other diseases classified elsewhere without behavioral disturbance: Secondary | ICD-10-CM

## 2015-11-02 NOTE — Therapy (Signed)
[facility] [address] [place], [place], [address] Phone: [phone]   Fax:  [phone]  Speech Language Pathology Treatment  Patient Details  Name: Ann Wright MRN: [id number] Date of Birth: [birth year] No Data Recorded  Encounter Date: 11/02/2015      End of Session - 11/02/15 1103    Visit Number 7   Number of Visits 16   Date for SLP Re-Evaluation 11/16/15   Authorization Type advanced re-eval date as pt has missed sessions due to travel   SLP Start Time 1015   SLP Stop Time  1101   SLP Time Calculation (min) 46 min   Activity Tolerance Patient tolerated treatment well      Past Medical History  Diagnosis Date  . High cholesterol   . 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 Primary progressive aphasia     Past Surgical History  Procedure Laterality Date  . Breast biopsy    . Breast lumpectomy      There were no vitals filed for this visit.  Visit Diagnosis: Primary progressive aphasia      Subjective Assessment - 11/02/15 1028    Subjective Pt's daughter just dx with breast cancer - pt tearful               ADULT SLP TREATMENT - 11/02/15 1029    General Information   Behavior/Cognition Alert;Cooperative;Pleasant mood   Treatment Provided   Treatment provided Cognitive-Linquistic   Pain Assessment   Pain Assessment No/denies pain   Cognitive-Linquistic Treatment   Treatment focused on Aphasia   Skilled Treatment Pt named 8 items for given moderately complex categories with min A. Facilitaed conversation through verbalizing different points of view., with rare min questioning cues to expand on verbalizing ideas. Trained pt in compensation for anomia via describing pictures for ST to guess    Assessment / Recommendations / Plan   Plan Continue with current plan of care   Progression Toward Goals   Progression toward goals Progressing toward goals            SLP Short Term Goals -  11/02/15 1102    SLP SHORT TERM GOAL #1   Title Pt will perform mildly complex naming tasks with 80% accuracy and occasional min verbal cues   Time 1   Period Weeks   Status Achieved   SLP SHORT TERM GOAL #2   Title Pt will demonstrate compensations for aphasia during structured language tasks with occasional min assistance   Time 1   Period Weeks   Status Achieved   SLP SHORT TERM GOAL #3   Title Pt will report participating in language enhancing activities daily outside of therapy over 3 sessions   Time 1   Period Weeks   Status Achieved          SLP Long Term Goals - 11/02/15 1102    SLP LONG TERM GOAL #1   Title Pt will utilize compensations for aphasia during simple 8 minute conversation with occasional min A   Time 3   Period Weeks   Status On-going   SLP LONG TERM GOAL #2   Title Pt will demonstrate multimodal communication (drawing/gestures, etc) in structured language tasks with 85% accuracy and occasional min A   Time 3   Period Weeks   Status On-going          Plan - 11/02/15 1059    Clinical Impression Statement Pt reports more difficulty with her conversational  speech/language due to her daughter being dx with cancer recently. Conversational speech is at phrase level with 2-3 word utterances and cues to use sentences and descriptive. Pt reports she is not using compensations for word finding episodes at home. I enouraged her to use descritions  during anomic episodes. Pt tearful throughtout sessio. Encouraged her to use stress management  activiities to  faciliated verbal expression   Speech Therapy Frequency 2x / week   Treatment/Interventions Compensatory strategies;Patient/family education;Multimodal communcation approach;Internal/external aids;SLP instruction and feedback;Language facilitation;Environmental controls   Potential to Achieve Goals Good   Potential Considerations Medical prognosis        Problem List Patient Active Problem List    Diagnosis Date Noted  . Primary progressive aphasia 08/09/2015    Brevin Mcfadden, Annye Rusk MS, CCC-SLP 11/02/2015, 11:04 AM  [facility] [address] [place], [place], [address] Phone: [phone]   Fax:  [phone]   Name: Marvella Jenning MRN: [id number] Date of Birth: Dec 25, 1956

## 2015-11-02 NOTE — Patient Instructions (Signed)
Homework provided 

## 2015-11-09 ENCOUNTER — Ambulatory Visit: Payer: 59 | Admitting: Speech Pathology

## 2015-11-09 DIAGNOSIS — F0280 Dementia in other diseases classified elsewhere without behavioral disturbance: Secondary | ICD-10-CM

## 2015-11-09 DIAGNOSIS — G3101 Pick's disease: Secondary | ICD-10-CM | POA: Diagnosis not present

## 2015-11-09 NOTE — Therapy (Signed)
[facility] [address] [place], [place], [address] Phone: [phone]   Fax:  [phone]  Speech Language Pathology Treatment  Patient Details  Name: Ann Wright MRN: [id number] Date of Birth: 02/02/1956 No Data Recorded  Encounter Date: 11/09/2015      End of Session - 11/09/15 1104    Visit Number 8   Number of Visits 16   Date for SLP Re-Evaluation 11/16/15   SLP Start Time 1017   SLP Stop Time  1100   SLP Time Calculation (min) 43 min   Activity Tolerance Patient tolerated treatment well      Past Medical History  Diagnosis Date  . High cholesterol   . 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 Primary progressive aphasia     Past Surgical History  Procedure Laterality Date  . Breast biopsy    . Breast lumpectomy      There were no vitals filed for this visit.  Visit Diagnosis: Primary progressive aphasia      Subjective Assessment - 11/09/15 1021    Subjective "I had an good weekend - did the homework"   Currently in Pain? No/denies               ADULT SLP TREATMENT - 11/09/15 1022    General Information   Behavior/Cognition Alert;Cooperative;Pleasant mood   Treatment Provided   Treatment provided Cognitive-Linquistic   Pain Assessment   Pain Assessment No/denies pain   Cognitive-Linquistic Treatment   Treatment focused on Aphasia   Skilled Treatment Reviewed homework - pt with 95% success on word puzzles, 100%  on word association which pt reported was fairly easy. Facilitated naming with generated 2-4 synonyms  for simple words with usual min to mod semantic and phonemic cues.  Pt  generated 1 opposite for mildly complex words with occasional min cues.  Trained in drawing as compensations for  progressive aphasia with simple words/topics with 90% success in SLP being able to guess drawing with know topic.    Assessment / Recommendations / Plan   Plan Continue with current plan of care   Progression Toward Goals   Progression toward goals Progressing toward goals          SLP Education - 11/09/15 1053    Education provided Yes   Person(s) Educated Patient   Methods Explanation   Comprehension Verbalized understanding          SLP Short Term Goals - 11/09/15 1103    SLP SHORT TERM GOAL #1   Title Pt will perform mildly complex naming tasks with 80% accuracy and occasional min verbal cues   Time 1   Period Weeks   Status Achieved   SLP SHORT TERM GOAL #2   Title Pt will demonstrate compensations for aphasia during structured language tasks with occasional min assistance   Time 1   Period Weeks   Status Achieved   SLP SHORT TERM GOAL #3   Title Pt will report participating in language enhancing activities daily outside of therapy over 3 sessions   Time 1   Period Weeks   Status Achieved          SLP Long Term Goals - 11/09/15 1104    SLP LONG TERM GOAL #1   Title Pt will utilize compensations for aphasia during simple 8 minute conversation with occasional min A   Time 3   Period Weeks   Status On-going   SLP LONG TERM GOAL #2  Title Pt will demonstrate multimodal communication (drawing/gestures, etc) in structured language tasks with 85% accuracy and occasional min A   Time 3   Period Weeks   Status Achieved          Plan - 11/09/15 1053    Clinical Impression Statement Naming with usual min A for naming tasks - pt has demonstrated compensations such as gestures, descriptive and drawing in anticipation for progressive aphasia with min A. I recommend pt continue skilled ST to maximize carryover of verbal compensations for aphasia (jher current language needs) and for carryover of language enhancing activies at home.    Speech Therapy Frequency 2x / week   Duration 1 week   Treatment/Interventions Compensatory strategies;Patient/family education;Multimodal communcation approach;Internal/external aids;SLP instruction and feedback;Language  facilitation;Environmental controls   Potential to Achieve Goals Good   Potential Considerations Medical prognosis   Consulted and Agree with Plan of Care Patient        Problem List Patient Active Problem List   Diagnosis Date Noted  . Primary progressive aphasia 08/09/2015    Lovvorn, Annye Rusk MS, CCC-SLP 11/09/2015, 11:05 AM  [facility] [address] [place], [place], [address] Phone: [phone]   Fax:  [phone]   Name: Ann Wright MRN: [id number] Date of Birth: 09/14/1956

## 2015-11-09 NOTE — Patient Instructions (Signed)
Continue homework packet,   Play charades and win loose or draw with family  Look for easy cross word puzzle

## 2015-11-11 ENCOUNTER — Ambulatory Visit: Payer: 59 | Admitting: Speech Pathology

## 2015-11-11 DIAGNOSIS — G3101 Pick's disease: Secondary | ICD-10-CM | POA: Diagnosis not present

## 2015-11-11 DIAGNOSIS — F0280 Dementia in other diseases classified elsewhere without behavioral disturbance: Secondary | ICD-10-CM

## 2015-11-11 NOTE — Therapy (Signed)
[facility] [address] [place], [place], [address] Phone: [phone]   Fax:  [phone]  Speech Language Pathology Treatment  Patient Details  Name: Ann Wright MRN: [id number] Date of Birth: Apr 04, 1956 No Data Recorded  Encounter Date: 11/11/2015      End of Session - 11/11/15 1114    Visit Number 9   Number of Visits 16   Date for SLP Re-Evaluation 11/16/15   SLP Start Time 1017   SLP Stop Time  1102   SLP Time Calculation (min) 45 min   Activity Tolerance Patient tolerated treatment well      Past Medical History  Diagnosis Date  . High cholesterol   . 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 Primary progressive aphasia     Past Surgical History  Procedure Laterality Date  . Breast biopsy    . Breast lumpectomy      There were no vitals filed for this visit.  Visit Diagnosis: Primary progressive aphasia      Subjective Assessment - 11/11/15 1041    Subjective "I did the crosswords"   Currently in Pain? No/denies               ADULT SLP TREATMENT - 11/11/15 1021    General Information   Behavior/Cognition Alert;Cooperative;Pleasant mood   Treatment Provided   Treatment provided Cognitive-Linquistic   Pain Assessment   Pain Assessment No/denies pain   Cognitive-Linquistic Treatment   Treatment focused on Aphasia   Skilled Treatment Generated list further language activities pt can do at home, instructed her to notify her local fire station that she has aphasia in case she needs to access 911 care, reviewed compensations for aphasia and encouraed her to reduce her avoidence of social situations/friends due to aphasia. Facilitated increased mean lenght utterance in coversation with topics in the news, with direct instruction to expand on her ideas and to use longer sentences as pt primarily speaks in 2-3 word utterances. Encouraged her to practice this at home.    Assessment / Recommendations /  Plan   Plan Discharge SLP treatment due to (comment)  goals met   Progression Toward Goals   Progression toward goals Progressing toward goals          SLP Education - 11/11/15 1111    Education provided Yes   Education Details compensations for aphasia, 911 access, language enhancing activities to do at home.   Person(s) Educated Patient   Methods Explanation;Handout   Comprehension Verbalized understanding    SPEECH THERAPY DISCHARGE SUMMARY  Visits from Start of Care: 9  Current functional level related to goals / functional outcomes:  See goals below.    Remaining deficits: Primary progressive aphasia   Education / Equipment: Multimodal compensations for progressive aphasia, language enhancing activities pt can do at home,   Plan: Patient agrees to discharge.  Patient goals were met. Patient is being discharged due to meeting the stated rehab goals.  ?????           SLP Short Term Goals - 11/11/15 1114    SLP SHORT TERM GOAL #1   Title Pt will perform mildly complex naming tasks with 80% accuracy and occasional min verbal cues   Time 1   Period Weeks   Status Achieved   SLP SHORT TERM GOAL #2   Title Pt will demonstrate compensations for aphasia during structured language tasks with occasional min assistance   Time 1   Period Weeks  Status Achieved   SLP SHORT TERM GOAL #3   Title Pt will report participating in language enhancing activities daily outside of therapy over 3 sessions   Time 1   Period Weeks   Status Achieved          SLP Long Term Goals - 11/11/15 1114    SLP LONG TERM GOAL #1   Title Pt will utilize compensations for aphasia during simple 8 minute conversation with occasional min A   Time 3   Period Weeks   Status Achieved   SLP LONG TERM GOAL #2   Title Pt will demonstrate multimodal communication (drawing/gestures, etc) in structured language tasks with 85% accuracy and occasional min A   Time 3   Period Weeks   Status  Achieved          Plan - 11/11/15 1112    Clinical Impression Statement Pt has improved her meann length utterance in structured conversation tasks with SLP. She reports doing language activities at home and has demonstrated multimodal compensations for aphasia. I have encouraged pt to practice the non linguistic strategies at home playing games like charades and win loose or draw, to prepare for progression of her aphaisa. Goals are met, pt is d/c'd from ST at this time.    Treatment/Interventions Compensatory strategies;Patient/family education;Multimodal communcation approach;Internal/external aids;SLP instruction and feedback;Language facilitation;Environmental controls   Potential Considerations Medical prognosis        Problem List Patient Active Problem List   Diagnosis Date Noted  . Primary progressive aphasia 08/09/2015    Lovvorn, Annye Rusk MS, CCC-SLP 11/11/2015, 11:15 AM  [facility] [address] [place], [place], [address] Phone: [phone]   Fax:  [phone]   Name: Ann Wright MRN: [id number] Date of Birth: 02-22-1956

## 2015-11-11 NOTE — Patient Instructions (Signed)
  [place]  Drawing practice  When you're having a good day - Try to use longer more descriptive sentences  Play word games - Name category, take turns with 61 year old naming items or word association in the category. If you want to make it harder, go in alphabetical order   Crosswords  Be aware of who/what you are avoiding  - try not to avoid too much  Stress, fatigue, illness will likely make language worse - If you have to make phone calls, talk to strangers, do it when you are most awake/alert  You can ask your doctor for an order for speech therapy if you feel like you need it.   **Go by your local fire station and let then know you have aphasia and may not be able to communicate specifics details of an emergency.   Continue reading and writing   -  Read articles and write summaries in paragraph  Ways to communicate when you can't think of the words  - Describe it, gesture it, draw it, word association

## 2015-11-25 ENCOUNTER — Other Ambulatory Visit: Payer: Self-pay

## 2015-11-25 DIAGNOSIS — Z1231 Encounter for screening mammogram for malignant neoplasm of breast: Secondary | ICD-10-CM

## 2015-12-29 ENCOUNTER — Ambulatory Visit
Admission: RE | Admit: 2015-12-29 | Discharge: 2015-12-29 | Disposition: A | Discharge status: Home / Self Care | Payer: 59 | Source: Ambulatory Visit

## 2015-12-29 DIAGNOSIS — Z1231 Encounter for screening mammogram for malignant neoplasm of breast: Secondary | ICD-10-CM

## 2015-12-30 ENCOUNTER — Other Ambulatory Visit: Payer: Self-pay | Admitting: Obstetrics & Gynecology

## 2015-12-31 ENCOUNTER — Encounter (HOSPITAL_COMMUNITY): Payer: Self-pay

## 2015-12-31 ENCOUNTER — Encounter (HOSPITAL_COMMUNITY)
Admission: RE | Admit: 2015-12-31 | Discharge: 2015-12-31 | Disposition: A | Discharge status: Home / Self Care | Payer: 59 | Source: Ambulatory Visit | Attending: Obstetrics & Gynecology | Admitting: Obstetrics & Gynecology

## 2015-12-31 ENCOUNTER — Other Ambulatory Visit: Payer: Self-pay

## 2015-12-31 DIAGNOSIS — C541 Malignant neoplasm of endometrium: Secondary | ICD-10-CM | POA: Diagnosis not present

## 2015-12-31 DIAGNOSIS — Z7982 Long term (current) use of aspirin: Secondary | ICD-10-CM | POA: Diagnosis not present

## 2015-12-31 DIAGNOSIS — D250 Submucous leiomyoma of uterus: Secondary | ICD-10-CM | POA: Diagnosis not present

## 2015-12-31 DIAGNOSIS — N950 Postmenopausal bleeding: Secondary | ICD-10-CM | POA: Diagnosis present

## 2015-12-31 DIAGNOSIS — Z853 Personal history of malignant neoplasm of breast: Secondary | ICD-10-CM | POA: Diagnosis not present

## 2015-12-31 DIAGNOSIS — E7800 Pure hypercholesterolemia, unspecified: Secondary | ICD-10-CM | POA: Diagnosis not present

## 2015-12-31 DIAGNOSIS — N840 Polyp of corpus uteri: Secondary | ICD-10-CM | POA: Diagnosis not present

## 2015-12-31 HISTORY — DX: Anxiety disorder, unspecified: F41.9

## 2015-12-31 LAB — CBC
HCT: 40.8 % (ref 36.0–46.0)
Hemoglobin: 14 g/dL (ref 12.0–15.0)
MCH: 30.2 pg (ref 26.0–34.0)
MCHC: 34.3 g/dL (ref 30.0–36.0)
MCV: 88.1 fL (ref 78.0–100.0)
PLATELETS: 331 10*3/uL (ref 150–400)
RBC: 4.63 MIL/uL (ref 3.87–5.11)
RDW: 13.5 % (ref 11.5–15.5)
WBC: 8 10*3/uL (ref 4.0–10.5)

## 2015-12-31 NOTE — Patient Instructions (Signed)
Your procedure is scheduled on:  January 03, 2016  Enter through the Main Entrance of [facility] at:  1:00pm   Pick up the phone at the desk and dial [phone].  Call this number if you have problems the morning of surgery: [phone].  Remember: Do NOT eat food: after midnight on Sunday  Do NOT drink clear liquids after:  10:30 am day of surgery  Take these medicines the morning of surgery with a SIP OF WATER:  None   Do NOT wear jewelry (body piercing), metal hair clips/bobby pins, make-up, or nail polish. Do NOT wear lotions, powders, or perfumes.  You may wear deoderant. Do NOT shave for 48 hours prior to surgery. Do NOT bring valuables to the hospital. Contacts, dentures, or bridgework may not be worn into surgery. Have a responsible adult drive you home and stay with you for 24 hours after your procedure.

## 2016-01-03 ENCOUNTER — Ambulatory Visit (HOSPITAL_COMMUNITY): Payer: 59 | Admitting: Anesthesiology

## 2016-01-03 ENCOUNTER — Encounter (HOSPITAL_COMMUNITY): Payer: Self-pay | Admitting: Emergency Medicine

## 2016-01-03 ENCOUNTER — Ambulatory Visit (HOSPITAL_COMMUNITY)
Admission: RE | Admit: 2016-01-03 | Discharge: 2016-01-03 | Disposition: A | Discharge status: Home / Self Care | Payer: 59 | Source: Ambulatory Visit | Attending: Obstetrics & Gynecology | Admitting: Obstetrics & Gynecology

## 2016-01-03 ENCOUNTER — Encounter (HOSPITAL_COMMUNITY)
Admission: RE | Disposition: A | Discharge status: Home / Self Care | Payer: Self-pay | Source: Ambulatory Visit | Attending: Obstetrics & Gynecology

## 2016-01-03 DIAGNOSIS — Z853 Personal history of malignant neoplasm of breast: Secondary | ICD-10-CM | POA: Insufficient documentation

## 2016-01-03 DIAGNOSIS — N840 Polyp of corpus uteri: Secondary | ICD-10-CM | POA: Insufficient documentation

## 2016-01-03 DIAGNOSIS — C541 Malignant neoplasm of endometrium: Secondary | ICD-10-CM | POA: Diagnosis not present

## 2016-01-03 DIAGNOSIS — E7800 Pure hypercholesterolemia, unspecified: Secondary | ICD-10-CM | POA: Insufficient documentation

## 2016-01-03 DIAGNOSIS — Z7982 Long term (current) use of aspirin: Secondary | ICD-10-CM | POA: Insufficient documentation

## 2016-01-03 DIAGNOSIS — N950 Postmenopausal bleeding: Secondary | ICD-10-CM | POA: Insufficient documentation

## 2016-01-03 HISTORY — DX: Adverse effect of unspecified anesthetic, initial encounter: T41.45XA

## 2016-01-03 HISTORY — DX: Other complications of anesthesia, initial encounter: T88.59XA

## 2016-01-03 HISTORY — PX: DILITATION & CURRETTAGE/HYSTROSCOPY WITH VERSAPOINT RESECTION: SHX5571

## 2016-01-03 SURGERY — DILATATION & CURETTAGE/HYSTEROSCOPY WITH VERSAPOINT RESECTION
Anesthesia: General

## 2016-01-03 MED ORDER — PROPOFOL 10 MG/ML IV BOLUS
INTRAVENOUS | Status: AC
  Filled 2016-01-03: qty 20

## 2016-01-03 MED ORDER — ONDANSETRON HCL 4 MG/2ML IJ SOLN
INTRAMUSCULAR | Status: AC
  Filled 2016-01-03: qty 2

## 2016-01-03 MED ORDER — ONDANSETRON HCL 4 MG/2ML IJ SOLN
INTRAMUSCULAR | Status: DC | PRN
  Administered 2016-01-03: 4 mg via INTRAVENOUS

## 2016-01-03 MED ORDER — DEXAMETHASONE SODIUM PHOSPHATE 10 MG/ML IJ SOLN
INTRAMUSCULAR | Status: DC | PRN
  Administered 2016-01-03: 10 mg via INTRAVENOUS

## 2016-01-03 MED ORDER — GLYCOPYRROLATE 0.2 MG/ML IJ SOLN
INTRAMUSCULAR | Status: AC
  Filled 2016-01-03: qty 1

## 2016-01-03 MED ORDER — PROMETHAZINE HCL 25 MG/ML IJ SOLN
INTRAMUSCULAR | Status: AC
  Filled 2016-01-03: qty 1

## 2016-01-03 MED ORDER — KETOROLAC TROMETHAMINE 30 MG/ML IJ SOLN
INTRAMUSCULAR | Status: DC | PRN
  Administered 2016-01-03: 30 mg via INTRAVENOUS

## 2016-01-03 MED ORDER — OXYCODONE-ACETAMINOPHEN 7.5-325 MG PO TABS: 1.0000 | ORAL_TABLET | ORAL | Status: DC | PRN

## 2016-01-03 MED ORDER — KETOROLAC TROMETHAMINE 30 MG/ML IJ SOLN: 30.0000 mg | Freq: Once | INTRAMUSCULAR | Status: DC | PRN

## 2016-01-03 MED ORDER — SODIUM CHLORIDE 0.9 % IR SOLN
Status: DC | PRN
  Administered 2016-01-03: 3000 mL

## 2016-01-03 MED ORDER — HYDROCODONE-ACETAMINOPHEN 7.5-325 MG PO TABS: 1.0000 | ORAL_TABLET | Freq: Once | ORAL | Status: DC | PRN

## 2016-01-03 MED ORDER — LACTATED RINGERS IV SOLN
INTRAVENOUS | Status: DC
  Administered 2016-01-03 (×2): via INTRAVENOUS

## 2016-01-03 MED ORDER — CHLOROPROCAINE HCL 1 % IJ SOLN
INTRAMUSCULAR | Status: DC | PRN
  Administered 2016-01-03: 20 mL

## 2016-01-03 MED ORDER — MEPERIDINE HCL 25 MG/ML IJ SOLN: 6.2500 mg | INTRAMUSCULAR | Status: DC | PRN

## 2016-01-03 MED ORDER — FENTANYL CITRATE (PF) 100 MCG/2ML IJ SOLN
INTRAMUSCULAR | Status: AC
  Filled 2016-01-03: qty 2

## 2016-01-03 MED ORDER — CEFAZOLIN SODIUM-DEXTROSE 2-3 GM-% IV SOLR
2.0000 g | Freq: Once | INTRAVENOUS | Status: AC
  Administered 2016-01-03: 2 g via INTRAVENOUS

## 2016-01-03 MED ORDER — CEFAZOLIN SODIUM-DEXTROSE 2-3 GM-% IV SOLR
INTRAVENOUS | Status: AC
  Filled 2016-01-03: qty 50

## 2016-01-03 MED ORDER — PROMETHAZINE HCL 25 MG/ML IJ SOLN
12.5000 mg | Freq: Four times a day (QID) | INTRAMUSCULAR | Status: DC | PRN
  Administered 2016-01-03: 12.5 mg via INTRAVENOUS

## 2016-01-03 MED ORDER — PROPOFOL 10 MG/ML IV BOLUS
INTRAVENOUS | Status: DC | PRN
  Administered 2016-01-03: 50 mg via INTRAVENOUS
  Administered 2016-01-03: 150 mg via INTRAVENOUS

## 2016-01-03 MED ORDER — PROMETHAZINE HCL 25 MG RE SUPP
25.0000 mg | RECTAL | Status: DC | PRN
Start: 2016-01-03 — End: 2016-01-03
  Administered 2016-01-03: 25 mg via RECTAL

## 2016-01-03 MED ORDER — DEXAMETHASONE SODIUM PHOSPHATE 10 MG/ML IJ SOLN
INTRAMUSCULAR | Status: AC
  Filled 2016-01-03: qty 1

## 2016-01-03 MED ORDER — CHLOROPROCAINE HCL 1 % IJ SOLN
INTRAMUSCULAR | Status: AC
  Filled 2016-01-03: qty 30

## 2016-01-03 MED ORDER — ONDANSETRON HCL 4 MG/2ML IJ SOLN
4.0000 mg | Freq: Once | INTRAMUSCULAR | Status: AC | PRN
  Administered 2016-01-03: 4 mg via INTRAVENOUS

## 2016-01-03 MED ORDER — KETOROLAC TROMETHAMINE 30 MG/ML IJ SOLN
INTRAMUSCULAR | Status: AC
  Filled 2016-01-03: qty 1

## 2016-01-03 MED ORDER — LIDOCAINE HCL (CARDIAC) 20 MG/ML IV SOLN
INTRAVENOUS | Status: DC | PRN
  Administered 2016-01-03: 100 mg via INTRAVENOUS

## 2016-01-03 MED ORDER — FENTANYL CITRATE (PF) 100 MCG/2ML IJ SOLN
25.0000 ug | INTRAMUSCULAR | Status: DC | PRN
  Administered 2016-01-03 (×2): 50 ug via INTRAVENOUS

## 2016-01-03 MED ORDER — FENTANYL CITRATE (PF) 100 MCG/2ML IJ SOLN
INTRAMUSCULAR | Status: DC | PRN
  Administered 2016-01-03 (×4): 25 ug via INTRAVENOUS

## 2016-01-03 MED ORDER — LIDOCAINE HCL (CARDIAC) 20 MG/ML IV SOLN
INTRAVENOUS | Status: AC
  Filled 2016-01-03: qty 5

## 2016-01-03 MED ORDER — ALPRAZOLAM 0.5 MG PO TABS: 0.5000 mg | ORAL_TABLET | Freq: Two times a day (BID) | ORAL | Status: DC | PRN

## 2016-01-03 MED ORDER — PROMETHAZINE HCL 25 MG RE SUPP
RECTAL | Status: AC
  Filled 2016-01-03: qty 1

## 2016-01-03 SURGICAL SUPPLY — 16 items
CANISTER SUCT 3000ML (MISCELLANEOUS) ×2 IMPLANT
CATH ROBINSON RED A/P 16FR (CATHETERS) ×2 IMPLANT
CLOTH BEACON ORANGE TIMEOUT ST (SAFETY) ×2 IMPLANT
CONTAINER PREFILL 10% NBF 60ML (FORM) ×4 IMPLANT
ELECTRODE ROLLER VERSAPOINT (ELECTRODE) IMPLANT
ELECTRODE RT ANGLE VERSAPOINT (CUTTING LOOP) IMPLANT
GLOVE BIO SURGEON STRL SZ 6.5 (GLOVE) ×2 IMPLANT
GLOVE BIOGEL PI IND STRL 7.0 (GLOVE) ×3 IMPLANT
GLOVE BIOGEL PI INDICATOR 7.0 (GLOVE) ×3
GOWN STRL REUS W/TWL LRG LVL3 (GOWN DISPOSABLE) ×4 IMPLANT
PACK VAGINAL MINOR WOMEN LF (CUSTOM PROCEDURE TRAY) ×2 IMPLANT
PAD OB MATERNITY 4.3X12.25 (PERSONAL CARE ITEMS) ×2 IMPLANT
TOWEL OR 17X24 6PK STRL BLUE (TOWEL DISPOSABLE) ×4 IMPLANT
TUBING AQUILEX INFLOW (TUBING) ×2 IMPLANT
TUBING AQUILEX OUTFLOW (TUBING) ×2 IMPLANT
WATER STERILE IRR 1000ML POUR (IV SOLUTION) ×2 IMPLANT

## 2016-01-03 NOTE — Discharge Summary (Signed)
  Physician Discharge Summary  Patient ID: Ann Wright MRN: [id number] DOB/AGE: [birth year] 60 y.o.  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 *   Discharged Condition: good  Hospital Course: Outpatient  Consults: None  Treatments: surgery: Hysteroscopy with Versapoint resection of SM Myoma and Polyp  Disposition: Final discharge disposition not confirmed     Medication List    TAKE these medications        ADVIL 200 MG tablet  Generic drug:  ibuprofen  Take 400 mg by mouth daily as needed for headache or mild pain.     ALPRAZolam 0.5 MG tablet  Commonly known as:  XANAX  Take 1 tablet (0.5 mg total) by mouth 2 (two) times daily as needed for anxiety.     aspirin EC 81 MG tablet  Take 81 mg by mouth daily.     atorvastatin 20 MG tablet  Commonly known as:  LIPITOR  Take 20 mg by mouth daily.     cholecalciferol 1000 units tablet  Commonly known as:  VITAMIN D  Take 1,000 Units by mouth daily.     multivitamin with minerals Tabs tablet  Take 1 tablet by mouth daily.     oxyCODONE-acetaminophen 7.5-325 MG tablet  Commonly known as:  PERCOCET  Take 1 tablet by mouth every 4 (four) hours as needed for severe pain.     vitamin B-12 1000 MCG tablet  Commonly known as:  CYANOCOBALAMIN  Take 1,000 mcg by mouth daily.           Follow-up Information    Follow up with Rini Moffit,MARIE-LYNE, MD In 3 weeks.   Specialty:  Obstetrics and Gynecology   Contact information:   [place] [address] [phone]       Signed: Princess Bruins, MD 01/03/2016, 4:10 PM

## 2016-01-03 NOTE — Discharge Instructions (Addendum)
Hysteroscopy, Care After °Refer to this sheet in the next few weeks. These instructions provide you with information on caring for yourself after your procedure. Your health care provider may also give you more specific instructions. Your treatment has been planned according to current medical practices, but problems sometimes occur. Call your health care provider if you have any problems or questions after your procedure.  °WHAT TO EXPECT AFTER THE PROCEDURE °After your procedure, it is typical to have the following: °· You may have some cramping. This normally lasts for a couple days. °· You may have bleeding. This can vary from light spotting for a few days to menstrual-like bleeding for 3-7 days. °HOME CARE INSTRUCTIONS °· Rest for the first 1-2 days after the procedure. °· Only take over-the-counter or prescription medicines as directed by your health care provider. Do not take aspirin. It can increase the chances of bleeding. °· Take showers instead of baths for 2 weeks or as directed by your health care provider. °· Do not drive for 24 hours or as directed. °· Do not drink alcohol while taking pain medicine. °· Do not use tampons, douche, or have sexual intercourse for 2 weeks or until your health care provider says it is okay. °· Take your temperature twice a day for 4-5 days. Write it down each time. °· Follow your health care provider's advice about diet, exercise, and lifting. °· If you develop constipation, you may: °¨ Take a mild laxative if your health care provider approves. °¨ Add bran foods to your diet. °¨ Drink enough fluids to keep your urine clear or pale yellow. °· Try to have someone with you or available to you for the first 24-48 hours, especially if you were given a general anesthetic. °· Follow up with your health care provider as directed. °SEEK MEDICAL CARE IF: °· You feel dizzy or lightheaded. °· You feel sick to your stomach (nauseous). °· You have abnormal vaginal discharge. °· You  have a rash. °· You have pain that is not controlled with medicine. °SEEK IMMEDIATE MEDICAL CARE IF: °· You have bleeding that is heavier than a normal menstrual period. °· You have a fever. °· You have increasing cramps or pain, not controlled with medicine. °· You have new belly (abdominal) pain. °· You pass out. °· You have pain in the tops of your shoulders (shoulder strap areas). °· You have shortness of breath. °  °This information is not intended to replace advice given to you by your health care provider. Make sure you discuss any questions you have with your health care provider. °  °Document Released: 10/01/2013 Document Reviewed: 10/01/2013 °Elsevier Interactive Patient Education ©2016 [employer] ° ° °Post Anesthesia Home Care Instructions ° °Activity: °Get plenty of rest for the remainder of the day. A responsible adult should stay with you for 24 hours following the procedure.  °For the next 24 hours, DO NOT: °-Drive a car °-Operate machinery °-Drink alcoholic beverages °-Take any medication unless instructed by your physician °-Make any legal decisions or sign important papers. ° °Meals: °Start with liquid foods such as gelatin or soup. Progress to regular foods as tolerated. Avoid greasy, spicy, heavy foods. If nausea and/or vomiting occur, drink only clear liquids until the nausea and/or vomiting subsides. Call your physician if vomiting continues. ° °Special Instructions/Symptoms: °Your throat may feel dry or sore from the anesthesia or the breathing tube placed in your throat during surgery. If this causes discomfort, gargle with warm salt water. The   discomfort should disappear within 24 hours. ° °If you had a scopolamine patch placed behind your ear for the management of post- operative nausea and/or vomiting: ° °1. The medication in the patch is effective for 72 hours, after which it should be removed.  Wrap patch in a tissue and discard in the trash. Wash hands thoroughly with soap and  water. °2. You may remove the patch earlier than 72 hours if you experience unpleasant side effects which may include dry mouth, dizziness or visual disturbances. °3. Avoid touching the patch. Wash your hands with soap and water after contact with the patch. °  ° ° °

## 2016-01-03 NOTE — Transfer of Care (Signed)
Immediate Anesthesia Transfer of Care Note  Patient: Ann Wright  Procedure(s) Performed: Procedure(s): DILATATION & CURETTAGE/HYSTEROSCOPY WITH VERSAPOINT RESECTION (N/A)  Patient Location: PACU  Anesthesia Type:General  Level of Consciousness: awake, alert  and oriented  Airway & Oxygen Therapy: Patient Spontanous Breathing and Patient connected to nasal cannula oxygen  Post-op Assessment: Report given to RN and Post -op Vital signs reviewed and stable  Post vital signs: Reviewed and stable  Last Vitals:  Filed Vitals:   01/03/16 1308  BP: 142/89  Pulse: 99  Temp: 36.7 C  Resp: 18    Complications: No apparent anesthesia complications

## 2016-01-03 NOTE — Op Note (Signed)
01/03/2016  3:50 PM  PATIENT:  Ann Wright  60 y.o. female  PRE-OPERATIVE DIAGNOSIS:  Endometrial Lesions  POST-OPERATIVE DIAGNOSIS:  Endometrial Polyp and Submucosal Myoma  PROCEDURE:  Procedure(s): DILATATION & CURETTAGE/HYSTEROSCOPY WITH VERSAPOINT RESECTION  SURGEON:  Surgeon(s): Princess Bruins, MD  ASSISTANTS: none   ANESTHESIA:   general   PROCEDURE:  Under general anesthesia with laryngeal mask the patient is in lithotomy position. She was prepped with Betadine on the suprapubic, vulvar and vaginal areas. She is draped as usual. The bladder was catheterized. The vaginal exam reveals an anteverted uterus normal volume and no adnexal masses. The speculum is inserted in the vagina and the anterior lip of the cervix is grasped with a tenaculum. A paracervical block is done with Nesacaine 1% a total of 20 cc at 4 and 8:00. Dilation of the cervix with Hegar dilators up to #31 without difficulty. The operative hysteroscope is inserted with the VersaPoint loop. Inspection of the intrauterine cavity reveals an anterior wall lesion with a large base probably corresponding to a submucosal myoma.  It measures about 2 cm in diameter.  On the posterior wall are very small polyp is present.  Both lesions are completely resected with the VersaPoint loop.  All pieces are removed and sent to pathology.  An endometrial curettage is done with a sharp curet on all intrauterine surfaces and the specimen is sent to pathology together with the resection specimen.  The hysteroscope was reintroduced in the intrauterine cavity and hemostasis was completed with the coag current using the VersaPoint loop.  Hemostasis was adequate at all levels. Both ostia were well visualized. Pictures were taken before and after resection. All instruments were removed.  Hemostasis was good on the cervix.  The speculum was removed as well.  The patient was brought to recovery room in good and stable status.  ESTIMATED BLOOD LOSS:  15 cc FLUID DEFICIT:  450 cc   Intake/Output Summary (Last 24 hours) at 01/03/16 1550 Last data filed at 01/03/16 1548  Gross per 24 hour  Intake   1300 ml  Output    110 ml  Net   1190 ml     BLOOD ADMINISTERED:none   LOCAL MEDICATIONS USED:  Nesacaine 1% 20 cc for Paracervical Block  SPECIMEN:  Source of Specimen:  Resection pieces of Polyp and SM Myoma, Endometrial Curettings  DISPOSITION OF SPECIMEN:  PATHOLOGY  COUNTS:  YES  PLAN OF CARE: Transfer to PACU

## 2016-01-03 NOTE — Anesthesia Preprocedure Evaluation (Addendum)
Anesthesia Evaluation  Patient identified by MRN, date of birth, ID band Patient awake    Reviewed: Allergy & Precautions, H&P , NPO status , Patient's Chart, lab work & pertinent test results, reviewed documented beta blocker date and time   Airway Mallampati: II  TM Distance: >3 FB Neck ROM: full    Dental  (+) Teeth Intact   Pulmonary neg pulmonary ROS,    Pulmonary exam normal        Cardiovascular negative cardio ROS Normal cardiovascular exam     Neuro/Psych negative neurological ROS     GI/Hepatic negative GI ROS, Neg liver ROS,   Endo/Other  negative endocrine ROS  Renal/GU negative Renal ROS     Musculoskeletal   Abdominal (+) + obese,   Peds  Hematology negative hematology ROS (+)   Anesthesia Other Findings   Reproductive/Obstetrics negative OB ROS                            Anesthesia Physical Anesthesia Plan  ASA: II  Anesthesia Plan: General   Post-op Pain Management:    Induction: Intravenous  Airway Management Planned: LMA  Additional Equipment:   Intra-op Plan:   Post-operative Plan:   Informed Consent: I have reviewed the patients History and Physical, chart, labs and discussed the procedure including the risks, benefits and alternatives for the proposed anesthesia with the patient or authorized representative who has indicated his/her understanding and acceptance.     Plan Discussed with: CRNA and Surgeon  Anesthesia Plan Comments:         Anesthesia Quick Evaluation

## 2016-01-03 NOTE — Anesthesia Postprocedure Evaluation (Signed)
Anesthesia Post Note  Patient: Ann Wright  Procedure(s) Performed: Procedure(s) (LRB): DILATATION & CURETTAGE/HYSTEROSCOPY WITH VERSAPOINT RESECTION (N/A)  Patient location during evaluation: PACU Anesthesia Type: General Level of consciousness: sedated Pain management: pain level controlled Vital Signs Assessment: post-procedure vital signs reviewed and stable Respiratory status: spontaneous breathing Cardiovascular status: stable Anesthetic complications: yes Anesthetic complication details: PONV   Last Vitals:  Filed Vitals:   01/03/16 1630 01/03/16 1645  BP: 139/86 139/80  Pulse: 77 83  Temp:    Resp: 12 10    Last Pain:  Filed Vitals:   01/03/16 1657  PainSc: [place]

## 2016-01-03 NOTE — Anesthesia Procedure Notes (Signed)
Procedure Name: LMA Insertion Date/Time: 01/03/2016 2:53 PM Performed by: Shakeena Kafer A Pre-anesthesia Checklist: Patient identified, Emergency Drugs available, Suction available and Patient being monitored Patient Re-evaluated:Patient Re-evaluated prior to inductionOxygen Delivery Method: Circle system utilized Preoxygenation: Pre-oxygenation with 100% oxygen Intubation Type: IV induction Ventilation: Mask ventilation without difficulty LMA: LMA inserted and LMA with gastric port inserted LMA Size: 4.0 Number of attempts: 1 Placement Confirmation: breath sounds checked- equal and bilateral and positive ETCO2 Tube secured with: Tape

## 2016-01-03 NOTE — H&P (Signed)
Ann Wright is an 60 y.o. female  G2P2  Breast Ca with BrCa2 pos  RP:  PMB with IU lesions for HSC/Resection/D+C  Pertinent Gynecological History:  Blood transfusions: none Sexually transmitted diseases: no past history Last pap: normal OB History:  G2P2   Menstrual History:  No LMP recorded. Patient is not currently having periods (Reason: Chemotherapy).  Menopause.    Past Medical History  Diagnosis Date  . High cholesterol   . Cancer ([place]) 1987    Breast   . Primary progressive aphasia   . Anxiety   . Primary progressive aphasia   . Complication of anesthesia     "I had a hard time of waking up."    Past Surgical History  Procedure Laterality Date  . Breast biopsy    . Breast lumpectomy    . Colonoscopy      Family History  Problem Relation Age of Onset  . Heart attack Mother   . Pneumonia Father   . Cancer Sister     breast  . Cancer Sister     breast    Social History:  reports that she has never smoked. She has never used smokeless tobacco. She reports that she drinks alcohol. She reports that she does not use illicit drugs.  Allergies: No Known Allergies  Prescriptions prior to admission  Medication Sig Dispense Refill Last Dose  . aspirin EC 81 MG tablet Take 81 mg by mouth daily.   Past Week at Unknown time  . atorvastatin (LIPITOR) 20 MG tablet Take 20 mg by mouth daily.   01/02/2016 at Unknown time  . cholecalciferol (VITAMIN D) 1000 units tablet Take 1,000 Units by mouth daily.     Marland Kitchen ibuprofen (ADVIL) 200 MG tablet Take 400 mg by mouth daily as needed for headache or mild pain.     . Multiple Vitamin (MULTIVITAMIN WITH MINERALS) TABS tablet Take 1 tablet by mouth daily.     . vitamin B-12 (CYANOCOBALAMIN) 1000 MCG tablet Take 1,000 mcg by mouth daily.     . Cholecalciferol (VITAMIN D) 2000 UNITS CAPS Take 1 capsule by mouth. Patient takes every other day.   Taking    ROS neg  Blood pressure 142/89, pulse 99, temperature 98 F (36.7 C),  temperature source Oral, resp. rate 18, SpO2 98 %. Physical Exam  Pelvic US/Sonohysto:  IU lesions c/w polyps, largest 1.9 cm ant wall.  Assessment/Plan: PMB with IU lesions, probable polyps for HSC Resection, D+C.  Surgery and risks reviewed.  Almeter Westhoff,MARIE-LYNE 01/03/2016, 1:50 PM

## 2016-01-04 ENCOUNTER — Encounter (HOSPITAL_COMMUNITY): Payer: Self-pay | Admitting: Obstetrics & Gynecology

## 2016-01-19 ENCOUNTER — Encounter: Payer: Self-pay | Admitting: Gynecologic Oncology

## 2016-01-19 ENCOUNTER — Ambulatory Visit: Payer: 59 | Admitting: Gynecologic Oncology

## 2016-01-19 ENCOUNTER — Ambulatory Visit: Payer: 59 | Attending: Gynecologic Oncology | Admitting: Gynecologic Oncology

## 2016-01-19 VITALS — BP 142/84 | HR 84 | Temp 98.0°F | Resp 18 | Ht 66.5 in | Wt 201.7 lb

## 2016-01-19 DIAGNOSIS — Z1501 Genetic susceptibility to malignant neoplasm of breast: Secondary | ICD-10-CM | POA: Diagnosis not present

## 2016-01-19 DIAGNOSIS — Z1509 Genetic susceptibility to other malignant neoplasm: Secondary | ICD-10-CM

## 2016-01-19 DIAGNOSIS — C541 Malignant neoplasm of endometrium: Secondary | ICD-10-CM | POA: Diagnosis not present

## 2016-01-19 NOTE — Patient Instructions (Addendum)
Pre-op on January 24, 2016 at 11 am.  Plan for surgery on February 7 with Dr. Nancy Marus.   Total Laparoscopic Hysterectomy A total laparoscopic hysterectomy is a minimally invasive surgery to remove your uterus and cervix. This surgery is performed by making several small cuts (incisions) in your abdomen. It can also be done with a thin, lighted tube (laparoscope) inserted into two small incisions in your lower abdomen. Your fallopian tubes and ovaries can be removed (bilateral salpingo-oophorectomy) during this surgery as well.Benefits of minimally invasive surgery include:  Less pain.  Less risk of blood loss.  Less risk of infection.  Quicker return to normal activities. LET [facility] CARE PROVIDER KNOW ABOUT:  Any allergies you have.  All medicines you are taking, including vitamins, herbs, eye drops, creams, and over-the-counter medicines.  Previous problems you or members of your family have had with the use of anesthetics.  Any blood disorders you have.  Previous surgeries you have had.  Medical conditions you have. RISKS AND COMPLICATIONS  Generally, this is a safe procedure. However, as with any procedure, complications can occur. Possible complications include:  Bleeding.  Blood clots in the legs or lung.  Infection.  Injury to surrounding organs.  Problems with anesthesia.  Early menopause symptoms (hot flashes, night sweats, insomnia).  Risk of conversion to an open abdominal incision. BEFORE THE PROCEDURE  Ask your health care provider about changing or stopping your regular medicines.  Do not take aspirin or blood thinners (anticoagulants) for 1 week before the surgery or as told by your health care provider.  Do not eat or drink anything for 8 hours before the surgery or as told by your health care provider.  Quit smoking if you smoke.  Arrange for a ride home after surgery and for someone to help you at home during recovery. PROCEDURE   You  will be given antibiotic medicine.  An IV tube will be placed in your arm. You will be given medicine to make you sleep (general anesthetic).  A gas (carbon dioxide) will be used to inflate your abdomen. This will allow your surgeon to look inside your abdomen, perform your surgery, and treat any other problems found if necessary.  Three or four small incisions (often less than 1/2 inch) will be made in your abdomen. One of these incisions will be made in the area of your belly button (navel). The laparoscope will be inserted into the incision. Your surgeon will look through the laparoscope while doing your procedure.  Other surgical instruments will be inserted through the other incisions.  Your uterus may be removed through your vagina or cut into small pieces and removed through the small incisions.  Your incisions will be closed. AFTER THE PROCEDURE  The gas will be released from inside your abdomen.  You will be taken to the recovery area where a nurse will watch and check your progress. Once you are awake, stable, and taking fluids well, without other problems, you will return to your room or be allowed to go home.  There is usually minimal discomfort following the surgery because the incisions are so small.  You will be given pain medicine while you are in the hospital and for when you go home.   This information is not intended to replace advice given to you by your health care provider. Make sure you discuss any questions you have with your health care provider.   Document Released: 10/08/2007 Document Revised: 08/13/2013 Document Reviewed: 07/01/2013 Elsevier  Interactive Patient Education 2016 [place].   Uterine Cancer Uterine cancer is an abnormal growth of tissue (tumor) in the uterus that is cancerous (malignant). Unlike noncancerous (benign) tumors, malignant tumors can spread to other parts of your body. The wall of the uterus has two layers of tissue. The inner  layer is the endometrium. The outer layer of muscle tissue is the myometrium. The most common type of uterine cancer begins in the endometrium. This is called endometrial cancer. Cancer that begins in the myometrium is called uterine sarcoma, which is very rare.  RISK FACTORS  Although the exact cause of uterine cancer is unknown, there are a number of risk factors that can increase your chances of getting uterine cancer. They include:  Your age. Uterine cancer occurs mostly in women older than 50 years.   Having an enlarged endometrium (endometrial hyperplasia).   Using hormone therapy.   Obesity.   Taking the drug tamoxifen.   White race.   Infertility.   Never being pregnant.   Beginning menstrual periods at an age younger than 12 years.   Having menstrual periods at an age older than 39 years.   Personal history of ovarian, intestinal, or colorectal cancer.   Having a family history of uterine cancer.   Having a family history of hereditary nonpolyposis colon cancer (HNPCC).   Having diabetes, high blood pressure, thyroid disease, or gallbladder disease.   Long-term use of high-dose birth control pills.   Exposure to radiation.   Smoking.  SIGNS AND SYMPTOMS   Abnormal vaginal bleeding or discharge. Bleeding may start as a watery, blood-streaked flow that gradually contains more blood.   Any vaginal bleeding after menopause.   Difficult or painful urination.   Pain during intercourse.   Pain in the pelvic area.  Mass in the vagina.  Pain or fullness in the abdomen.  Frequent urination.  Bleeding between periods.  Growth of the stomach.   Unexplained weight loss.  Uterine cancer usually occurs after menopause. However, it may also occur around the time that menopause begins. Abnormal vaginal bleeding is the most common symptom of uterine cancer. Women should not assume that abnormal vaginal bleeding is part of menopause. DIAGNOSIS   Your health care provider will ask about your medical history. He or she may also perform a number of procedures, such as:  A physical and pelvic exam. Your health care provider will feel your pelvis for any lumps.   Blood and urine tests.   X-rays.   Imaging tests, such as CT scans, ultrasonography, or MRIs.   A hysteroscopy to view the inside of your uterus.   A Pap test to sample cells from the cervix and upper vagina to check for abnormal cells.   Taking a tissue sample (biopsy) from the uterine lining to look for cancer cells.   A dilation and curettage (D&C). This involves stretching (dilation) the cervix and scraping (curettage) the inside lining of the uterus to get a tissue sample. The sample is examined under a microscope to look for cancer cells.  Your cancer will be staged to determine its severity and extent. Staging is a careful attempt to find out the size of the tumor, whether the cancer has spread, and if so, to what parts of the body. You may need to have more tests to determine the stage of your cancer. The test results will help determine what treatment plan is best for you. Cancer stages include:   Stage I. The cancer is  only found in the uterus.  Stage II. The cancer has spread to the cervix.  Stage III. The cancer has spread outside the uterus, but not outside the pelvis. The cancer may have spread to the lymph nodes in the pelvis.  Stage IV. The cancer has spread to other parts of the body, such as the bladder or rectum. TREATMENT  Most women with uterine cancer are treated with surgery. This includes removing the uterus, cervix, fallopian tubes, and ovaries (total hysterectomy). Your lymph nodes near the tumor may also be removed. Some women have radiation, chemotherapy, or hormonal therapy. Other women have a combination of these therapies. HOME CARE INSTRUCTIONS   Take medicines only as directed by your health care provider.   Maintain a healthy  diet.  Exercise regularly.   If you have diabetes, high blood pressure, thyroid disease, or gallbladder disease, follow your health care provider's instructions to keep it under control.   Do not smoke.   Consider joining a support group. This may help you learn to cope with the stress of having uterine cancer.   Seek advice to help you manage treatment side effects.   Keep all follow-up visits as directed by your health care provider.  SEEK MEDICAL CARE IF:  You have increased stomach or pelvic pain.  You cannot urinate.  You have abnormal bleeding.   This information is not intended to replace advice given to you by your health care provider. Make sure you discuss any questions you have with your health care provider.   Document Released: 12/11/2005 Document Revised: 01/01/2015 Document Reviewed: 05/30/2013 Elsevier Interactive Patient Education [employer].

## 2016-01-19 NOTE — Progress Notes (Signed)
Consult Note: Gyn-Onc  Ann Wright 60 y.o. female  CC:  Chief Complaint  Patient presents with  . endometrial adenocarcinoma    New Consultation    HPI: Patient is seen today in consultation at the request of Dr. Dellis Filbert.  Patient is a very pleasant 60 year old gravida 2 para 2 some menopausal since about the age of 26. She never took any hormone replacement therapy. Of note the patient is BRCA1 positive. I do not have records of this. Her daughter has a personal history of breast cancer and has undergone genetic testing. She is similarly BRCA1 positive. Proximal me 2 months ago the patient began experiencing some vaginal bleeding. Occasionally was bright occasionally with start. There is no cramping associated with it she subsequently underwent a hysteroscopic work section of endometrial polyps and a D&C on 12/31/2015. 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 Approximately 5 days after the procedure the patient's daughter called stating the patient was having chest pain and she was encouraged to the emergency room. The patient states she did not have chest pain she thinks that she was having significant abdominal pain from constipation and when she had a bowel movement her symptoms resolved. She has been complaining of some pain in the back of her left leg since the procedure.   She denies any change in her bowel or bladder habits. She is due for colonoscopy this year. Her last was 5 years ago. She is up-to-date on her mammograms. She's not been having MRIs in a sequential fashion secondary to her genetic mutation.   Review of Systems  Constitutional:  Denies fever. Skin: No rash Cardiovascular: No chest pain, shortness of breath, or edema  Pulmonary: No cough  Gastro Intestinal: No nausea, vomiting, constipation, or diarrhea reported. No change in bowel movement.  Genitourinary: No frequency,  urgency, or dysuria.  Denies further vaginal bleeding or discharge.  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 aspirin EC 81 MG tablet Take 81 mg by mouth daily.  Marland Kitchen atorvastatin (LIPITOR) 20 MG tablet Take 20 mg by mouth daily.  . cholecalciferol (VITAMIN D) 1000 units tablet Take 1,000 Units by mouth daily.  Marland Kitchen ibuprofen (ADVIL) 200 MG tablet Take 400 mg by mouth daily as needed for headache or mild pain.  . Multiple Vitamin (MULTIVITAMIN WITH MINERALS) TABS tablet Take 1 tablet by mouth daily.  Marland Kitchen oxyCODONE-acetaminophen (PERCOCET) 7.5-325 MG tablet Take 1 tablet by mouth every 4 (four) hours as needed for severe pain.  . vitamin B-12 (CYANOCOBALAMIN) 1000 MCG tablet Take 1,000 mcg by mouth daily.  Marland Kitchen ALPRAZolam (XANAX) 0.5 MG tablet Take 1 tablet (0.5 mg total) by mouth 2 (two) times daily as needed for anxiety. (Patient not taking: Reported on 01/19/2016)  . loratadine (CLARITIN) 10 MG tablet Take 10 mg by mouth.   No facility-administered encounter medications on file as of 01/19/2016.    Allergy: No Known Allergies  Social Hx:   Social History   Social History  . Marital Status: Married    Spouse Name: michael  . Number of Children: 2  . Years of Education: 12   Occupational History  . retired    Social History Main Topics  . Smoking status: Never Smoker   . Smokeless tobacco: Never Used  . Alcohol  Use: Yes     Comment: occasional   . Drug Use: No  . Sexual Activity: Not on file   Other Topics Concern  . Not on file   Social History Narrative   Patient drinks 3 cups of caffeine daily.   Patient is right handed.    Past Surgical Hx:  Past Surgical History  Procedure Laterality Date  . Breast biopsy    . Breast lumpectomy    . Colonoscopy    . Dilitation &  currettage/hystroscopy with versapoint resection N/A 01/03/2016    Procedure: DILATATION & CURETTAGE/HYSTEROSCOPY WITH VERSAPOINT RESECTION;  Surgeon: Princess Bruins, MD;  Location: [facility];  Service: Gynecology;  Laterality: N/A;    Past Medical Hx:  Past Medical History  Diagnosis Date  . High cholesterol   . Cancer ([place]) 1987    Breast   . Primary progressive aphasia   . Anxiety   . Primary progressive aphasia   . Complication of anesthesia     "I had a hard time of waking up."    Oncology Hx:    Endometrial ca [facility])   01/03/2016 Initial Diagnosis Endometrial ca [facility])    Family Hx:  Family History  Problem Relation Age of Onset  . Heart attack Mother   . Pneumonia Father   . Cancer Sister     breast  . Cancer Sister     breast    Vitals:  Blood pressure 142/84, pulse 84, temperature 98 F (36.7 C), temperature source Oral, resp. rate 18, height 5' 6.5" (1.689 m), weight 201 lb 11.2 oz (91.491 kg), SpO2 99 %.  Physical Exam: Well-nourished well-developed female in no acute distress.  Neck: Supple, no lymphadenopathy, no thyromegaly.  Lungs: Clear to auscultation bilaterally.  Cardiovascular: Regular rate and rhythm.  Abdomen: Soft, nontender, nondistended. There are no palpable masses. There is no hepatosplenomegaly.  Groins: No lymphadenopathy.  Extremities: Normal female genitalia. Vagina slightly atrophic. The cervix is visualized. There are no visible lesions. Cervix is multiparous. Bimanual examination the corpus is of normal size shape and consistency. There are no adnexal masses. Rectal confirms.  Assessment/Plan:   60 year old a personal history of an early breast cancer age of 54 who is BRCA1 positive. She now has a grade 2 clinical stage I endometrioid adenocarcinoma. I met with her and her family.  I discussed with them that the usual treatment for endometrial cancer consists of a hysterectomy with removal of the uterus, cervix, and the bilateral  adnexa. In her case secondary to her genetic mutation we will serially section the ovaries and fallopian tubes. I do believe she is a candidate for sentinel lymph node biopsy. They are familiar with this concept as her daughter recently had sentinel lymph node dissection for her breast cancer. I believe the patient is a candidate for a minimally invasive surgery.  The next available date here in [place] is February 28. However, she would like to have her surgery done as soon as possible. Therefore, she scheduled for surgery at [facility] on February 7. She has a preop visit on Monday, January 30.  Risks and benefits of surgery were discussed with the patient and her family. Their questions were elicited in answer to their satisfaction. She is asking that  if we need to contact her by phone, call and ask for her husband she has her progressive expressive aphasia and it is difficult for her to speak by phone. She is able to understand everything that is being communicated with her  however. Next  We discussed prophylactic surgery for her breast in the setting of her genetic mutation a personal history of breast cancer. At this time they do not wish to proceed with prophylactic mastectomy but there would be interested in increased screening.  We appreciate the opportunity to partner in the care of this very lovely patient. Murle Otting A., MD 01/19/2016, 12:16 PM

## 2016-01-21 ENCOUNTER — Other Ambulatory Visit: Payer: Self-pay | Admitting: Internal Medicine

## 2016-01-21 ENCOUNTER — Ambulatory Visit
Admission: RE | Admit: 2016-01-21 | Discharge: 2016-01-21 | Disposition: A | Discharge status: Home / Self Care | Payer: 59 | Source: Ambulatory Visit | Attending: Internal Medicine | Admitting: Internal Medicine

## 2016-01-21 DIAGNOSIS — M79652 Pain in left thigh: Secondary | ICD-10-CM

## 2016-02-01 ENCOUNTER — Telehealth: Payer: Self-pay | Admitting: Gynecologic Oncology

## 2016-02-01 NOTE — Telephone Encounter (Signed)
Returned call to patient's husband.  He reports the patient having a cold last week, which she was given a z-pak for (started on Friday).  Her last dose is today.  He states the sore throat and cough are improving.  No fevers except for low grade temp of 99.4 last pm.  "Today she is fine.  No fever."  Asking if she can take the last antibiotic dose today and asking if she is still on for surgery tomorrow.  Advised the patient's husband that the decision would have to come from [facility].  Advised him that I would make Dr. Alycia Rossetti and her RN, [facility] aware of the situation.  Advised he would be contacted for any recommendations from [facility] if any. Advised to call for any questions or concerns.

## 2016-02-08 ENCOUNTER — Ambulatory Visit (INDEPENDENT_AMBULATORY_CARE_PROVIDER_SITE_OTHER): Payer: 59 | Admitting: Neurology

## 2016-02-08 ENCOUNTER — Encounter: Payer: Self-pay | Admitting: Neurology

## 2016-02-08 VITALS — BP 142/92 | HR 99 | Ht 66.0 in | Wt 205.5 lb

## 2016-02-08 DIAGNOSIS — F0280 Dementia in other diseases classified elsewhere without behavioral disturbance: Secondary | ICD-10-CM | POA: Diagnosis not present

## 2016-02-08 DIAGNOSIS — G3101 Pick's disease: Secondary | ICD-10-CM

## 2016-02-08 NOTE — Progress Notes (Signed)
Reason for visit: Primary progressive aphasia  Ann Wright is an 60 y.o. female  History of present illness:  Ann Wright is a 60 year old right-handed white female with a history of a primary progressive aphasia that has been present for about 2-1/2 years. She continues to get worse, even over the last 6 months. She is having increasing difficulty generating speech. She seemed to do better with handwriting, and with texting on the telephone. She is able to understand what people are saying fairly well. She is performing all activities of daily living, she has not given up doing anything because of the aphasia. She has a lot of difficulty communicating on the telephone, however. Speech is slow, deliberate, monosyllabic. She denies any weakness or numbness of extremities, she denies any difficulty with choking or swallowing. She returns for an evaluation.  Past Medical History  Diagnosis Date  . High cholesterol   . Cancer ([place]) 1987    Breast   . Primary progressive aphasia   . Anxiety   . Primary progressive aphasia   . Complication of anesthesia     "I had a hard time of waking up."    Past Surgical History  Procedure Laterality Date  . Breast biopsy    . Breast lumpectomy    . Colonoscopy    . Dilitation & currettage/hystroscopy with versapoint resection N/A 01/03/2016    Procedure: DILATATION & CURETTAGE/HYSTEROSCOPY WITH VERSAPOINT RESECTION;  Surgeon: Princess Bruins, MD;  Location: [facility];  Service: Gynecology;  Laterality: N/A;    Family History  Problem Relation Age of Onset  . Heart attack Mother   . Pneumonia Father   . Cancer Sister     breast  . Cancer Sister     breast  . Cancer Daughter     breast    Social history:  reports that she has never smoked. She has never used smokeless tobacco. She reports that she does not drink alcohol or use illicit drugs.   No Known Allergies  Medications:  Prior to Admission medications   Medication Sig Start Date  End Date Taking? Authorizing Provider  aspirin EC 81 MG tablet Take 81 mg by mouth daily.   Yes Historical Provider, MD  atorvastatin (LIPITOR) 20 MG tablet Take 20 mg by mouth daily.   Yes Historical Provider, MD  cholecalciferol (VITAMIN D) 1000 units tablet Take 1,000 Units by mouth daily.   Yes Historical Provider, MD  ibuprofen (ADVIL) 200 MG tablet Take 400 mg by mouth daily as needed for headache or mild pain.   Yes Historical Provider, MD  loratadine (CLARITIN) 10 MG tablet Take 10 mg by mouth.   Yes Historical Provider, MD  Multiple Vitamin (MULTIVITAMIN WITH MINERALS) TABS tablet Take 1 tablet by mouth daily.   Yes Historical Provider, MD  oxyCODONE-acetaminophen (PERCOCET) 7.5-325 MG tablet Take 1 tablet by mouth every 4 (four) hours as needed for severe pain. 01/03/16  Yes Princess Bruins, MD  vitamin B-12 (CYANOCOBALAMIN) 1000 MCG tablet Take 1,000 mcg by mouth daily.   Yes Historical Provider, MD    ROS:  Out of a complete 14 system review of symptoms, the patient complains only of the following symptoms, and all other reviewed systems are negative.  Snoring  Blood pressure 142/92, pulse 99, height 5\' 6"  (1.676 m), weight 205 lb 8 oz (93.214 kg).  Physical Exam  General: The patient is alert and cooperative at the time of the examination.  Skin: No significant peripheral edema is noted.  Neurologic Exam  Mental status: The patient is alert and oriented x 3 at the time of the examination. The patient has apparent normal recent and remote memory, with an apparently normal attention span and concentration ability.   Cranial nerves: Facial symmetry is present. Speech aphasia, nonfluent. The patient is able to repeat relatively well, she has more difficulty with spontaneous language. Extraocular movements are full. Visual fields are full.  Motor: The patient has good strength in all 4 extremities.  Sensory examination: Soft touch sensation is symmetric on the face, arms,  and legs.  Coordination: The patient has good finger-nose-finger and heel-to-shin bilaterally.  Gait and station: The patient has a normal gait. Tandem gait is normal. Romberg is negative. No drift is seen.  Reflexes: Deep tendon reflexes are symmetric.   Assessment/Plan:  1. Primary progressive aphasia  The patient continues to progress slowly. She clearly is interested in engaging in research, but I am not aware of any active programs currently. The patient will return in about 9 months. I have indicated that she can get an application for her cell phone or I-pad that will allow her to type a phrase, and then have it repeated in an audio fashion to help her communicate. She will follow-up in 9 months.  Jill Alexanders MD 02/08/2016 2:57 PM  [facility] [address] [place], [place] [address]  Phone [phone] Fax [phone]

## 2016-02-08 NOTE — Patient Instructions (Signed)
Aphasia Aphasia is damage to the part of your brain that you need to communicate. For most people, that area is on the left side of the brain. Aphasia does not affect your intelligence, but you may struggle to talk, understand speech, read, or write. Aphasia can happen to anyone at any age, but it is most common in older age. CAUSES  An interruption of blood supply to the brain (stroke) is the most common cause of aphasia. Any disease or disorder that damages the communication areas of the brain can cause aphasia. This includes:   Brain tumors.  Brain injuries.  Brain infections.  Progressive diseases of the nervous system (neurological disorders). RISK FACTORS You may be at risk for aphasia if you have had any trauma, disease, or disorder that damaged the communication areas of the brain. SIGNS AND SYMPTOMS  Aphasia may start suddenly if it is caused by a stroke or brain injury. Aphasia caused by a tumor or a progressive neurological disorder may start gradually. The condition affects people differently. Signs and symptoms of aphasia include:  Trouble finding the right word.  Using the wrong words.  Talking in sentences that do not make sense.  Making up words.  Being unable to understand other people's speech.  Having problems writing, spelling, or reading.  Having trouble with numbers.  Having trouble swallowing. DIAGNOSIS  Your health care provider may suspect you have aphasia if you lose the ability to speak or understand language. You may need to see a specialist (speech and language pathologist) to help determine the diagnosis of aphasia. This person may do a series of tests to check your ability to:  Speak.  Express ideas.  Make conversation.  Understand speech.  Read and write. TREATMENT  In some cases, aphasia may improve on its own over time. Treatment for aphasia usually involves therapy with a pathologist. Your treatment will be designed to meet your needs  and abilities. Common treatments include:  Speech therapy.  Learning other ways to communicate.  Working with family members to find the best ways to communicate.  Working with an occupational therapist to find ways to communicate at work. HOME CARE INSTRUCTIONS  Keep all follow-up appointments.  Make sure you have a good support system at home.  The following techniques may be helpful while communicating:  Use short, simple sentences. Ask family members to do the same. Sentences that require one-word answers are easiest.  Avoid distractions like background noise when trying to listen or talk.  Try communicating with gestures, pointing, or drawing.  Talk slowly. Ask family members to talk to you slowly.  Maintain eye contact when communicating. SEEK MEDICAL CARE IF:  Your symptoms change or get worse.  You need more support at home.  You are struggling with anxiety or depression.  You develop trouble swallowing.   This information is not intended to replace advice given to you by your health care provider. Make sure you discuss any questions you have with your health care provider.   Document Released: 09/02/2002 Document Revised: 01/01/2015 Document Reviewed: 03/02/2014 Elsevier Interactive Patient Education [employer].

## 2016-03-13 ENCOUNTER — Ambulatory Visit: Payer: 59 | Attending: Gynecologic Oncology | Admitting: Gynecologic Oncology

## 2016-03-13 ENCOUNTER — Encounter: Payer: Self-pay | Admitting: Gynecologic Oncology

## 2016-03-13 VITALS — BP 129/71 | HR 84 | Temp 97.9°F | Resp 18 | Wt 201.0 lb

## 2016-03-13 DIAGNOSIS — F419 Anxiety disorder, unspecified: Secondary | ICD-10-CM | POA: Insufficient documentation

## 2016-03-13 DIAGNOSIS — C541 Malignant neoplasm of endometrium: Secondary | ICD-10-CM | POA: Diagnosis not present

## 2016-03-13 DIAGNOSIS — Z7982 Long term (current) use of aspirin: Secondary | ICD-10-CM | POA: Insufficient documentation

## 2016-03-13 DIAGNOSIS — Z853 Personal history of malignant neoplasm of breast: Secondary | ICD-10-CM | POA: Insufficient documentation

## 2016-03-13 DIAGNOSIS — Z90710 Acquired absence of both cervix and uterus: Secondary | ICD-10-CM | POA: Diagnosis not present

## 2016-03-13 DIAGNOSIS — R4701 Aphasia: Secondary | ICD-10-CM | POA: Diagnosis not present

## 2016-03-13 DIAGNOSIS — E7800 Pure hypercholesterolemia, unspecified: Secondary | ICD-10-CM | POA: Diagnosis not present

## 2016-03-13 NOTE — Patient Instructions (Signed)
Plan to follow up in September 2017 or sooner if needed.  Please call [phone] in June or July to schedule your appointment with Dr. Alycia Rossetti or Dr. Denman George in Sept. Please call for any questions or concerns.

## 2016-03-14 ENCOUNTER — Encounter: Payer: Self-pay | Admitting: Gynecologic Oncology

## 2016-03-14 NOTE — Progress Notes (Signed)
ENDOMETRIAL CANCER FOLLOW-UP  Assessment:    60 y.o. year old with Stage IA Grade 1 endometrioid endometrial cancer.   S/p robotic hysterectomy, BSO, SLN biopsy on 02/01/16. negative LVSI, no myometrial invasion, negative pelvic washings and negative lymph nodes.  Plan: 1) Pathology reports reviewed today 2) Treatment counseling - Very low risk (<5%) for recurrence given age, grade, depth of myometrial invasion and LVSI status. Multidisciplinary tumor board recommendation is for routine surveillance with frequent pelvic exams.  We will start with visits every 6 months x 5 years, at which time she can return to annual visits.  Discussed signs and symptoms of recurrence including vaginal bleeding or discharge, leg pain or swelling and changes in bowel or bladder habits. She was given the opportunity to ask questions, which were answered to her satisfaction, and she is agreement with the above mentioned plan of care.  3)  Return to clinic in 6 months to see Dr Alycia Rossetti or myself and in 12 months to see Dr Dellis Filbert.  HPI:  Ann Wright is a 60 y.o. year old with primary progressive aphasia initially seen in consultation on 01/19/16 for grade 2 endometrial cancer referred by Dr Dellis Filbert.  She then underwent a robotic hysterectomy, BSO, and SLN biopsy at [facility] with Dr Nancy Marus on [id number] without complications.  Her postoperative course was uncomplicated.  Her final pathologic diagnosis is a Stage IA Grade 1 endometrioid endometrial cancer with no lymphovascular space invasion, no myometrial invasion and negative lymph nodes (tumor confined to endometrium).  She is seen today for a postoperative check and to discuss her pathology results and treatment plan.  Since discharge from the hospital, she is feeling well.  She has improving appetite, normal bowel and bladder function, and pain controlled with minimal PO medication. She has no other complaints today.  Past Medical History  Diagnosis Date  .  High cholesterol   . Cancer ([place]) 1987    Breast   . Primary progressive aphasia   . Anxiety   . Primary progressive aphasia   . Complication of anesthesia     "I had a hard time of waking up."   Past Surgical History  Procedure Laterality Date  . Breast biopsy    . Breast lumpectomy    . Colonoscopy    . Dilitation & currettage/hystroscopy with versapoint resection N/A 01/03/2016    Procedure: DILATATION & CURETTAGE/HYSTEROSCOPY WITH VERSAPOINT RESECTION;  Surgeon: Princess Bruins, MD;  Location: [facility];  Service: Gynecology;  Laterality: N/A;   Family History  Problem Relation Age of Onset  . Heart attack Mother   . Pneumonia Father   . Cancer Sister     breast  . Cancer Sister     breast  . Cancer Daughter     breast   Social History   Social History  . Marital Status: Married    Spouse Name: michael  . Number of Children: 2  . Years of Education: 12   Occupational History  . retired    Social History Main Topics  . Smoking status: Never Smoker   . Smokeless tobacco: Never Used  . Alcohol Use: No     Comment: occasional   . Drug Use: No  . Sexual Activity: Not Currently   Other Topics Concern  . Not on file   Social History Narrative   Patient drinks 3 cups of caffeine daily.   Patient is right handed.    Current Outpatient Prescriptions on File Prior to Visit  Medication Sig Dispense Refill  . aspirin EC 81 MG tablet Take 81 mg by mouth daily.    Marland Kitchen atorvastatin (LIPITOR) 20 MG tablet Take 20 mg by mouth daily.    . cholecalciferol (VITAMIN D) 1000 units tablet Take 1,000 Units by mouth daily.    . Multiple Vitamin (MULTIVITAMIN WITH MINERALS) TABS tablet Take 1 tablet by mouth daily.    . vitamin B-12 (CYANOCOBALAMIN) 1000 MCG tablet Take 1,000 mcg by mouth daily.    Marland Kitchen ibuprofen (ADVIL) 200 MG tablet Take 400 mg by mouth daily as needed for headache or mild pain. Reported on 03/13/2016    . oxyCODONE-acetaminophen (PERCOCET) 7.5-325 MG tablet Take 1  tablet by mouth every 4 (four) hours as needed for severe pain. (Patient not taking: Reported on 03/13/2016) 30 tablet 0   No current facility-administered medications on file prior to visit.   No Known Allergies  Review of systems: Constitutional:  She has no weight gain or weight loss. She has no fever or chills. Eyes: No blurred vision Ears, Nose, Mouth, Throat: No dizziness, headaches or changes in hearing. No mouth sores. Cardiovascular: No chest pain, palpitations or edema. Respiratory:  No shortness of breath, wheezing or cough Gastrointestinal: She has normal bowel movements without diarrhea or constipation. She denies any nausea or vomiting. She denies blood in her stool or heart burn. Genitourinary:  She denies pelvic pain, pelvic pressure or changes in her urinary function. She has no hematuria, dysuria, or incontinence. She has no irregular vaginal bleeding or vaginal discharge Musculoskeletal: Denies muscle weakness or joint pains.  Skin:  She has no skin changes, rashes or itching Neurological:  Denies dizziness or headaches. No neuropathy, no numbness or tingling. Psychiatric:  She denies depression or anxiety. Hematologic/Lymphatic:   No easy bruising or bleeding   Physical Exam: Blood pressure 129/71, pulse 84, temperature 97.9 F (36.6 C), temperature source Oral, resp. rate 18, weight 201 lb (91.173 kg), SpO2 99 %. General: Well dressed, well nourished in no apparent distress.   HEENT:  Normocephalic and atraumatic, no lesions.  Extraocular muscles intact. Sclerae anicteric. Pupils equal, round, reactive. No mouth sores or ulcers. Thyroid is normal size, not nodular, midline. Abdomen:  Soft, nontender, nondistended.  No palpable masses.  No hepatosplenomegaly.  No ascites. Normal bowel sounds.  No hernias.  Incisions are well healed Genitourinary: Normal EGBUS  Vaginal cuff intact.  No bleeding or discharge.  No cul de sac fullness. Extremities: No cyanosis, clubbing or  edema.  No calf tenderness or erythema. No palpable cords. Psychiatric: Mood and affect are appropriate. Neurological: Awake, alert and oriented x 3. Sensation is intact, no neuropathy.  Musculoskeletal: No pain, normal strength and range of motion.  30 minutes of direct face to face counseling time was spent with the patient. This included discussion about prognosis, therapy recommendations and postoperative side effects and are beyond the scope of routine postoperative care.  Donaciano Eva, MD

## 2016-08-03 ENCOUNTER — Other Ambulatory Visit: Payer: Self-pay | Admitting: Gastroenterology

## 2016-09-08 ENCOUNTER — Encounter (HOSPITAL_COMMUNITY): Payer: Self-pay | Admitting: *Deleted

## 2016-09-12 ENCOUNTER — Encounter (HOSPITAL_COMMUNITY)
Admission: RE | Disposition: A | Discharge status: Home / Self Care | Payer: Self-pay | Source: Ambulatory Visit | Attending: Gastroenterology

## 2016-09-12 ENCOUNTER — Ambulatory Visit (HOSPITAL_COMMUNITY)
Admission: RE | Admit: 2016-09-12 | Discharge: 2016-09-12 | Disposition: A | Discharge status: Home / Self Care | Payer: 59 | Source: Ambulatory Visit | Attending: Gastroenterology | Admitting: Gastroenterology

## 2016-09-12 ENCOUNTER — Encounter (HOSPITAL_COMMUNITY): Payer: Self-pay

## 2016-09-12 ENCOUNTER — Ambulatory Visit (HOSPITAL_COMMUNITY): Payer: 59 | Admitting: Anesthesiology

## 2016-09-12 DIAGNOSIS — K5730 Diverticulosis of large intestine without perforation or abscess without bleeding: Secondary | ICD-10-CM | POA: Diagnosis not present

## 2016-09-12 DIAGNOSIS — Z853 Personal history of malignant neoplasm of breast: Secondary | ICD-10-CM | POA: Diagnosis not present

## 2016-09-12 DIAGNOSIS — Z1211 Encounter for screening for malignant neoplasm of colon: Secondary | ICD-10-CM | POA: Diagnosis not present

## 2016-09-12 DIAGNOSIS — Z8542 Personal history of malignant neoplasm of other parts of uterus: Secondary | ICD-10-CM | POA: Insufficient documentation

## 2016-09-12 DIAGNOSIS — E669 Obesity, unspecified: Secondary | ICD-10-CM | POA: Insufficient documentation

## 2016-09-12 DIAGNOSIS — I10 Essential (primary) hypertension: Secondary | ICD-10-CM | POA: Insufficient documentation

## 2016-09-12 DIAGNOSIS — Z6831 Body mass index (BMI) 31.0-31.9, adult: Secondary | ICD-10-CM | POA: Diagnosis not present

## 2016-09-12 DIAGNOSIS — Z79899 Other long term (current) drug therapy: Secondary | ICD-10-CM | POA: Diagnosis not present

## 2016-09-12 DIAGNOSIS — F419 Anxiety disorder, unspecified: Secondary | ICD-10-CM | POA: Diagnosis not present

## 2016-09-12 DIAGNOSIS — E7800 Pure hypercholesterolemia, unspecified: Secondary | ICD-10-CM | POA: Diagnosis not present

## 2016-09-12 DIAGNOSIS — Z7982 Long term (current) use of aspirin: Secondary | ICD-10-CM | POA: Diagnosis not present

## 2016-09-12 HISTORY — PX: COLONOSCOPY WITH PROPOFOL: SHX5780

## 2016-09-12 HISTORY — DX: Diverticulitis of intestine, part unspecified, without perforation or abscess without bleeding: K57.92

## 2016-09-12 SURGERY — COLONOSCOPY WITH PROPOFOL
Anesthesia: Monitor Anesthesia Care

## 2016-09-12 MED ORDER — PROPOFOL 10 MG/ML IV BOLUS
INTRAVENOUS | Status: AC
  Filled 2016-09-12: qty 40

## 2016-09-12 MED ORDER — LACTATED RINGERS IV SOLN
INTRAVENOUS | Status: DC | PRN
  Administered 2016-09-12: 09:00:00 via INTRAVENOUS

## 2016-09-12 MED ORDER — LACTATED RINGERS IV SOLN
INTRAVENOUS | Status: DC
  Administered 2016-09-12: 1000 mL via INTRAVENOUS

## 2016-09-12 MED ORDER — PROPOFOL 10 MG/ML IV BOLUS
INTRAVENOUS | Status: DC | PRN
  Administered 2016-09-12 (×2): 20 mg via INTRAVENOUS
  Administered 2016-09-12: 40 mg via INTRAVENOUS
  Administered 2016-09-12: 20 mg via INTRAVENOUS
  Administered 2016-09-12: 40 mg via INTRAVENOUS
  Administered 2016-09-12: 20 mg via INTRAVENOUS
  Administered 2016-09-12: 10 mg via INTRAVENOUS
  Administered 2016-09-12 (×5): 20 mg via INTRAVENOUS

## 2016-09-12 MED ORDER — SODIUM CHLORIDE 0.9 % IV SOLN: INTRAVENOUS | Status: DC

## 2016-09-12 SURGICAL SUPPLY — 22 items

## 2016-09-12 NOTE — Op Note (Signed)
[facility] Patient Name: Ann Wright Procedure Date: 09/12/2016 MRN: [id number] Attending MD: Garlan Fair , MD Date of Birth: 1955-12-31 CSN: [id number] Age: 60 Admit Type: Outpatient Procedure:                Colonoscopy Indications:              Screening for colorectal malignant neoplasm Providers:                Garlan Fair, MD, Zenon Mayo, RN, Corliss Parish, Technician Referring MD:              Medicines:                Propofol per Anesthesia Complications:            No immediate complications. Estimated Blood Loss:     Estimated blood loss: none. Procedure:                Pre-Anesthesia Assessment:                           - Prior to the procedure, a History and Physical                            was performed, and patient medications and                            allergies were reviewed. The patient's tolerance of                            previous anesthesia was also reviewed. The risks                            and benefits of the procedure and the sedation                            options and risks were discussed with the patient.                            All questions were answered, and informed consent                            was obtained. Prior Anticoagulants: The patient has                            taken aspirin, last dose was 1 day prior to                            procedure. ASA Grade Assessment: II - A patient                            with mild systemic disease. After reviewing the  risks and benefits, the patient was deemed in                            satisfactory condition to undergo the procedure.                           After obtaining informed consent, the colonoscope                            was passed under direct vision. Throughout the                            procedure, the patient's blood pressure, pulse, and   oxygen saturations were monitored continuously. The                            [id number]) scope was introduced through                            the anus and advanced to the the cecum, identified                            by appendiceal orifice and ileocecal valve. The                            colonoscopy was somewhat difficult due to                            significant looping. The patient tolerated the                            procedure well. The quality of the bowel                            preparation was good. The ileocecal valve, the                            appendiceal orifice and the rectum were                            photographed. Scope In: 9:30:02 AM Scope Out: 9:47:28 AM Scope Withdrawal Time: 0 hours 8 minutes 10 seconds  Total Procedure Duration: 0 hours 17 minutes 26 seconds  Findings:      The perianal and digital rectal examinations were normal.      The entire examined colon appeared normal. Impression:               - The entire examined colon is normal.                           - No specimens collected. Moderate Sedation:      N/A- Per Anesthesia Care Recommendation:           - Patient has a contact number available for  emergencies. The signs and symptoms of potential                            delayed complications were discussed with the                            patient. Return to normal activities tomorrow.                            Written discharge instructions were provided to the                            patient.                           - Repeat colonoscopy in 10 years for screening                            purposes.                           - Resume previous diet.                           - Continue present medications. Procedure Code(s):        --- Professional ---                           [id number], Colorectal cancer screening; colonoscopy on                            individual not meeting  criteria for high risk Diagnosis Code(s):        --- Professional ---                           Z12.11, Encounter for screening for malignant                            neoplasm of colon CPT copyright 2016 [employer]. All rights reserved. The codes documented in this report are preliminary and upon coder review may  be revised to meet current compliance requirements. Earle Gell, MD Garlan Fair, MD 09/12/2016 9:53:47 AM This report has been signed electronically. Number of Addenda: 0

## 2016-09-12 NOTE — Anesthesia Preprocedure Evaluation (Signed)
Anesthesia Evaluation  Patient identified by MRN, date of birth, ID band Patient awake    Reviewed: Allergy & Precautions, NPO status , Patient's Chart, lab work & pertinent test results  History of Anesthesia Complications (+) PONV, PROLONGED EMERGENCE and history of anesthetic complications  Airway Mallampati: III  TM Distance: >3 FB Neck ROM: Full    Dental  (+) Teeth Intact, Dental Advisory Given   Pulmonary neg pulmonary ROS,    Pulmonary exam normal breath sounds clear to auscultation       Cardiovascular Exercise Tolerance: Good negative cardio ROS Normal cardiovascular exam Rhythm:Regular Rate:Normal     Neuro/Psych PSYCHIATRIC DISORDERS Anxiety Primary progressive aphasia negative neurological ROS     GI/Hepatic negative GI ROS, Neg liver ROS,   Endo/Other  Obesity   Renal/GU negative Renal ROS     Musculoskeletal negative musculoskeletal ROS (+)   Abdominal   Peds  Hematology negative hematology ROS (+)   Anesthesia Other Findings Day of surgery medications reviewed with the patient.  Breast ca s/p lumpectomy  Reproductive/Obstetrics                             Anesthesia Physical Anesthesia Plan  ASA: II  Anesthesia Plan: MAC   Post-op Pain Management:    Induction: Intravenous  Airway Management Planned: Nasal Cannula  Additional Equipment:   Intra-op Plan:   Post-operative Plan:   Informed Consent: I have reviewed the patients History and Physical, chart, labs and discussed the procedure including the risks, benefits and alternatives for the proposed anesthesia with the patient or authorized representative who has indicated his/her understanding and acceptance.   Dental advisory given  Plan Discussed with: CRNA and Anesthesiologist  Anesthesia Plan Comments: (Discussed risks/benefits/alternatives to MAC sedation including need for ventilatory support,  hypotension, need for conversion to general anesthesia.  All patient questions answered.  Patient/guardian wishes to proceed.)        Anesthesia Quick Evaluation

## 2016-09-12 NOTE — Transfer of Care (Signed)
Immediate Anesthesia Transfer of Care Note  Patient: Ann Wright  Procedure(s) Performed: Procedure(s): COLONOSCOPY WITH PROPOFOL (N/A)  Patient Location: PACU and Endoscopy Unit  Anesthesia Type:MAC  Level of Consciousness: awake, oriented, patient cooperative, lethargic and responds to stimulation  Airway & Oxygen Therapy: Patient Spontanous Breathing and Patient connected to face mask oxygen  Post-op Assessment: Report given to RN, Post -op Vital signs reviewed and stable and Patient moving all extremities  Post vital signs: Reviewed and stable  Last Vitals:  Vitals:   09/12/16 0809 09/12/16 0956  BP: (!) 157/79 (!) 114/28  Pulse: 82 78  Resp: (!) 21 11  Temp: 36.6 C     Last Pain:  Vitals:   09/12/16 0809  TempSrc: Oral         Complications: No apparent anesthesia complications

## 2016-09-12 NOTE — Discharge Instructions (Signed)

## 2016-09-12 NOTE — Anesthesia Postprocedure Evaluation (Signed)
Anesthesia Post Note  Patient: Ann Wright  Procedure(s) Performed: Procedure(s) (LRB): COLONOSCOPY WITH PROPOFOL (N/A)  Patient location during evaluation: Endoscopy Anesthesia Type: MAC Level of consciousness: awake and alert Pain management: pain level controlled Vital Signs Assessment: post-procedure vital signs reviewed and stable Respiratory status: spontaneous breathing, nonlabored ventilation, respiratory function stable and patient connected to nasal cannula oxygen Cardiovascular status: stable and blood pressure returned to baseline Anesthetic complications: no    Last Vitals:  Vitals:   09/12/16 0809 09/12/16 0956  BP: (!) 157/79 (!) 114/28  Pulse: 82 78  Resp: (!) 21 11  Temp: 36.6 C     Last Pain:  Vitals:   09/12/16 0809  TempSrc: Oral                 [name]

## 2016-09-12 NOTE — H&P (Signed)
Procedure: Screening colonoscopy. Baseline screening colonoscopy was performed on 01/26/2006  History: The patient is a 60 year old female born in [birth year]. She is scheduled to undergo a repeat screening colonoscopy today.  Past medical history: Total abdominal hysterectomy. Bilateral salpingo-oophorectomy. Right lumpectomy to treat breast cancer. Hypercholesterolemia. Allergic rhinitis. Hypertension. Endometrial cancer.  Medication allergies: None  Exam: The patient is alert and lying comfortably on the endoscopy stretcher. Abdomen is soft and nontender to palpation. Lungs are clear to auscultation. Cardiac exam reveals a regular rhythm.  Plan: Proceed with screening colonoscopy

## 2016-11-09 ENCOUNTER — Ambulatory Visit: Payer: 59 | Admitting: Neurology

## 2016-12-07 ENCOUNTER — Encounter: Payer: Self-pay | Admitting: Neurology

## 2016-12-07 ENCOUNTER — Ambulatory Visit (INDEPENDENT_AMBULATORY_CARE_PROVIDER_SITE_OTHER): Payer: 59 | Admitting: Neurology

## 2016-12-07 VITALS — BP 149/92 | HR 100 | Ht 66.5 in | Wt 206.5 lb

## 2016-12-07 DIAGNOSIS — F0280 Dementia in other diseases classified elsewhere without behavioral disturbance: Secondary | ICD-10-CM | POA: Diagnosis not present

## 2016-12-07 DIAGNOSIS — G3101 Pick's disease: Secondary | ICD-10-CM | POA: Diagnosis not present

## 2016-12-07 NOTE — Progress Notes (Signed)
Reason for visit: Primary progressive aphasia  Ann Wright is an 60 y.o. female  History of present illness:  Ann Wright is a 60 year old right-handed white female with a history of primary progressive aphasia. The patient has gradually worsened slightly since last seen. The husband comes in with her and he questions whether or not the statin drug may have some impact on her current condition. The patient has been on low-dose statin therapy for a number of years. The patient is still active, she has not given up doing any activities of daily living because of the aphasia. The patient is still working word puzzles on a regular basis, and is fairly successful doing this. She mainly has difficulty generating language, her ability to understand language is much better. There have been no other issues such as headaches, dizziness, numbness or weakness, balance changes, or difficulty swallowing.  Past Medical History:  Diagnosis Date  . Anxiety   . Cancer ([place]) 1987   Breast - no chemotherapy required  . Complication of anesthesia    "I had a hard time of waking up."  . Diverticulitis    x 2 episodes- none recent  . High cholesterol   . Primary progressive aphasia    limited speech" does understand verbal commands" per spouse  . Primary progressive aphasia     Past Surgical History:  Procedure Laterality Date  . BREAST BIOPSY    . BREAST LUMPECTOMY    . COLONOSCOPY    . COLONOSCOPY WITH PROPOFOL N/A 09/12/2016   Procedure: COLONOSCOPY WITH PROPOFOL;  Surgeon: Garlan Fair, MD;  Location: [facility];  Service: Endoscopy;  Laterality: N/A;  . DILITATION & CURRETTAGE/HYSTROSCOPY WITH VERSAPOINT RESECTION N/A 01/03/2016   Procedure: DILATATION & CURETTAGE/HYSTEROSCOPY WITH VERSAPOINT RESECTION;  Surgeon: Princess Bruins, MD;  Location: [facility];  Service: Gynecology;  Laterality: N/A;    Family History  Problem Relation Age of Onset  . Heart attack Mother   . Pneumonia  Father   . Cancer Sister     breast  . Cancer Sister     breast  . Cancer Daughter     breast    Social history:  reports that she has never smoked. She has never used smokeless tobacco. She reports that she does not drink alcohol or use drugs.   No Known Allergies  Medications:  Prior to Admission medications   Medication Sig Start Date End Date Taking? Authorizing Provider  aspirin EC 81 MG tablet Take 81 mg by mouth daily.   Yes Historical Provider, MD  atorvastatin (LIPITOR) 20 MG tablet Take 20 mg by mouth daily.   Yes Historical Provider, MD  cholecalciferol (VITAMIN D) 1000 units tablet Take 1,000 Units by mouth daily.   Yes Historical Provider, MD  ibuprofen (ADVIL) 200 MG tablet Take 400 mg by mouth daily as needed for headache or mild pain. Reported on 03/13/2016   Yes Historical Provider, MD  Multiple Vitamin (MULTIVITAMIN WITH MINERALS) TABS tablet Take 1 tablet by mouth daily.   Yes Historical Provider, MD  vitamin B-12 (CYANOCOBALAMIN) 1000 MCG tablet Take 1,000 mcg by mouth daily.   Yes Historical Provider, MD    ROS:  Out of a complete 14 system review of symptoms, the patient complains only of the following symptoms, and all other reviewed systems are negative.  Snoring  Blood pressure (!) 149/92, pulse 100, height 5' 6.5" (1.689 m), weight 206 lb 8 oz (93.7 kg).  Physical Exam  General:  The patient is alert and cooperative at the time of the examination.  Skin: No significant peripheral edema is noted.   Neurologic Exam  Mental status: The patient is alert and oriented x 3 at the time of the examination. The patient has apparent normal recent and remote memory, with an apparently normal attention span and concentration ability.   Cranial nerves: Facial symmetry is present. Speech is associated with a nonfluent aphasia. The patient is able to read with some difficulty. The patient is able to understand pure verbal commands much better, but not perfectly.  Extraocular movements are full. Visual fields are full.  Motor: The patient has good strength in all 4 extremities.  Sensory examination: Soft touch sensation is symmetric on the face, arms, and legs.  Coordination: The patient has good finger-nose-finger and heel-to-shin bilaterally.  Gait and station: The patient has a normal gait. Tandem gait is normal. Romberg is negative. No drift is seen.  Reflexes: Deep tendon reflexes are symmetric.   Assessment/Plan:  1. Primary progressive aphasia  This patient continues to worsen slowly over time. The husband is concerned that the patient may be having some side effects from the statin drug, a trial off of this for 3 months may be reasonable, but I do not believe that the statin drug is the etiology of the primary progressive aphasia issue. The patient will follow-up in 9 months, sooner if needed.  Jill Alexanders MD 12/07/2016 10:29 AM  [facility] [address] [place], [place] [address]  Phone [phone] Fax [phone]

## 2017-01-23 ENCOUNTER — Ambulatory Visit
Admission: RE | Admit: 2017-01-23 | Discharge: 2017-01-23 | Disposition: A | Discharge status: Home / Self Care | Payer: 59 | Source: Ambulatory Visit | Attending: Internal Medicine | Admitting: Internal Medicine

## 2017-01-23 ENCOUNTER — Other Ambulatory Visit: Payer: Self-pay | Admitting: Internal Medicine

## 2017-01-23 DIAGNOSIS — Z1231 Encounter for screening mammogram for malignant neoplasm of breast: Secondary | ICD-10-CM

## 2017-01-23 DIAGNOSIS — G3101 Pick's disease: Secondary | ICD-10-CM | POA: Diagnosis not present

## 2017-01-23 DIAGNOSIS — R7303 Prediabetes: Secondary | ICD-10-CM | POA: Diagnosis not present

## 2017-01-23 DIAGNOSIS — C50911 Malignant neoplasm of unspecified site of right female breast: Secondary | ICD-10-CM | POA: Diagnosis not present

## 2017-01-23 DIAGNOSIS — E7800 Pure hypercholesterolemia, unspecified: Secondary | ICD-10-CM | POA: Diagnosis not present

## 2017-03-13 DIAGNOSIS — Z01419 Encounter for gynecological examination (general) (routine) without abnormal findings: Secondary | ICD-10-CM | POA: Diagnosis not present

## 2017-03-13 DIAGNOSIS — C541 Malignant neoplasm of endometrium: Secondary | ICD-10-CM | POA: Diagnosis not present

## 2017-06-18 DIAGNOSIS — B370 Candidal stomatitis: Secondary | ICD-10-CM | POA: Diagnosis not present

## 2017-06-18 DIAGNOSIS — J069 Acute upper respiratory infection, unspecified: Secondary | ICD-10-CM | POA: Diagnosis not present

## 2017-07-03 DIAGNOSIS — E7800 Pure hypercholesterolemia, unspecified: Secondary | ICD-10-CM | POA: Diagnosis not present

## 2017-07-03 DIAGNOSIS — R7303 Prediabetes: Secondary | ICD-10-CM | POA: Diagnosis not present

## 2017-07-03 DIAGNOSIS — Z23 Encounter for immunization: Secondary | ICD-10-CM | POA: Diagnosis not present

## 2017-07-03 DIAGNOSIS — G3101 Pick's disease: Secondary | ICD-10-CM | POA: Diagnosis not present

## 2017-07-03 DIAGNOSIS — Z1159 Encounter for screening for other viral diseases: Secondary | ICD-10-CM | POA: Diagnosis not present

## 2017-07-03 DIAGNOSIS — E669 Obesity, unspecified: Secondary | ICD-10-CM | POA: Diagnosis not present

## 2017-07-03 DIAGNOSIS — Z0000 Encounter for general adult medical examination without abnormal findings: Secondary | ICD-10-CM | POA: Diagnosis not present

## 2017-07-25 DIAGNOSIS — H2511 Age-related nuclear cataract, right eye: Secondary | ICD-10-CM | POA: Diagnosis not present

## 2017-07-27 ENCOUNTER — Other Ambulatory Visit: Payer: Self-pay | Admitting: Internal Medicine

## 2017-07-27 ENCOUNTER — Ambulatory Visit
Admission: RE | Admit: 2017-07-27 | Discharge: 2017-07-27 | Disposition: A | Discharge status: Home / Self Care | Payer: 59 | Source: Ambulatory Visit | Attending: Internal Medicine | Admitting: Internal Medicine

## 2017-07-27 DIAGNOSIS — M79603 Pain in arm, unspecified: Secondary | ICD-10-CM | POA: Diagnosis not present

## 2017-07-27 DIAGNOSIS — M25511 Pain in right shoulder: Secondary | ICD-10-CM

## 2017-07-27 DIAGNOSIS — S4991XA Unspecified injury of right shoulder and upper arm, initial encounter: Secondary | ICD-10-CM | POA: Diagnosis not present

## 2017-07-27 DIAGNOSIS — M791 Myalgia: Secondary | ICD-10-CM | POA: Diagnosis not present

## 2017-08-23 DIAGNOSIS — M25511 Pain in right shoulder: Secondary | ICD-10-CM | POA: Diagnosis not present

## 2017-09-06 ENCOUNTER — Encounter: Payer: Self-pay | Admitting: Neurology

## 2017-09-06 ENCOUNTER — Ambulatory Visit (INDEPENDENT_AMBULATORY_CARE_PROVIDER_SITE_OTHER): Payer: 59 | Admitting: Neurology

## 2017-09-06 VITALS — BP 148/92 | HR 86 | Ht 66.5 in | Wt 196.0 lb

## 2017-09-06 DIAGNOSIS — G3101 Pick's disease: Secondary | ICD-10-CM | POA: Diagnosis not present

## 2017-09-06 DIAGNOSIS — F0280 Dementia in other diseases classified elsewhere without behavioral disturbance: Secondary | ICD-10-CM | POA: Diagnosis not present

## 2017-09-06 MED ORDER — GABAPENTIN 100 MG PO CAPS: 100.0000 mg | ORAL_CAPSULE | Freq: Three times a day (TID) | ORAL | 3 refills | Status: DC

## 2017-09-06 NOTE — Patient Instructions (Signed)
   We will start gabapentin 100 mg three times a day for the neuromuscular pain.  Call for any dose adjustments.  Neurontin (gabapentin) may result in drowsiness, ankle swelling, gait instability, or possibly dizziness. Please contact our office if significant side effects occur with this medication.

## 2017-09-06 NOTE — Progress Notes (Signed)
Reason for visit: Primary progressive aphasia  Ann Wright is an 61 y.o. female  History of present illness:  Ann Wright is a 61 year old right-handed white female with a history of a primary progressive aphasia. The patient continues to have worsening difficulty with speech. She is now having increasing problems with nonfluent handwriting and texting on the telephone. The patient continues to do word puzzles and seems to do well with this. The patient has had a recent fall, she likely has a rotator cuff tear on the right shoulder, she can no longer elevate the right arm past about 50. The patient has seen orthopedic surgery for this. The patient is severely claustrophobic and does not wish to have an MRI of the shoulder. The patient has developed burning discomfort in the buttocks area that has been present for about a year. This is worse with sitting, she feels better when she is up and active. She does have occasional back pain, she denies any neuromuscular pain in the shoulders. She denies pain down the legs on either side and she does not report any numbness or weakness of the extremities. She is on a cholesterol medication, Lipitor. She returns for an evaluation.  Past Medical History:  Diagnosis Date  . Anxiety   . Cancer ([place]) 1987   Breast - no chemotherapy required  . Complication of anesthesia    "I had a hard time of waking up."  . Diverticulitis    x 2 episodes- none recent  . High cholesterol   . Primary progressive aphasia    limited speech" does understand verbal commands" per spouse  . Primary progressive aphasia     Past Surgical History:  Procedure Laterality Date  . BREAST BIOPSY    . BREAST LUMPECTOMY    . COLONOSCOPY    . COLONOSCOPY WITH PROPOFOL N/A 09/12/2016   Procedure: COLONOSCOPY WITH PROPOFOL;  Surgeon: Garlan Fair, MD;  Location: [facility];  Service: Endoscopy;  Laterality: N/A;  . DILITATION & CURRETTAGE/HYSTROSCOPY WITH VERSAPOINT  RESECTION N/A 01/03/2016   Procedure: DILATATION & CURETTAGE/HYSTEROSCOPY WITH VERSAPOINT RESECTION;  Surgeon: Princess Bruins, MD;  Location: [facility];  Service: Gynecology;  Laterality: N/A;    Family History  Problem Relation Age of Onset  . Heart attack Mother   . Pneumonia Father   . Cancer Sister        breast  . Cancer Sister        breast  . Cancer Daughter        breast    Social history:  reports that she has never smoked. She has never used smokeless tobacco. She reports that she does not drink alcohol or use drugs.   No Known Allergies  Medications:  Prior to Admission medications   Medication Sig Start Date End Date Taking? Authorizing Provider  aspirin EC 81 MG tablet Take 81 mg by mouth daily.   Yes [provider]  atorvastatin (LIPITOR) 20 MG tablet Take 20 mg by mouth daily.   Yes [provider]  cholecalciferol (VITAMIN D) 1000 units tablet Take 1,000 Units by mouth daily.   Yes [provider]  ibuprofen (ADVIL) 200 MG tablet Take 400 mg by mouth daily as needed for headache or mild pain. Reported on 03/13/2016   Yes [provider]  Loratadine (CLARITIN PO) Take 1 tablet by mouth daily.   Yes [provider]  metFORMIN (GLUCOPHAGE) 500 MG tablet Take by mouth daily with breakfast.  Yes [provider]  Multiple Vitamin (MULTIVITAMIN WITH MINERALS) TABS tablet Take 1 tablet by mouth daily.   Yes [provider]  vitamin B-12 (CYANOCOBALAMIN) 1000 MCG tablet Take 1,000 mcg by mouth daily.   Yes [provider]    ROS:  Out of a complete 14 system review of symptoms, the patient complains only of the following symptoms, and all other reviewed systems are negative.  Memory loss, speech difficulty Anxiety Back pain, achy muscles, neck pain  Blood pressure (!) 148/92, pulse 86, height 5' 6.5" (1.689 m), weight 196 lb (88.9 kg).  Physical Exam  General: The patient is alert and  cooperative at the time of the examination.  Neuromuscular: The patient elevate the right arm to only about 50. Patient has full range of motion movement of the low back.  Skin: No significant peripheral edema is noted.   Neurologic Exam  Mental status: The patient is alert and oriented x 3 at the time of the examination. The patient has apparent normal recent and remote memory, with an apparently normal attention span and concentration ability.   Cranial nerves: Facial symmetry is present. Speech is limited, aphasic, monosyllabic, simulating a Broca's type aphasia. The patient has difficulty with repeating phrases and with naming.Marland Kitchen Extraocular movements are full. Visual fields are full.  Motor: The patient has good strength in all 4 extremities.  Sensory examination: Soft touch sensation is symmetric on the face, arms, and legs.  Coordination: The patient has good finger-nose-finger and heel-to-shin bilaterally.  Gait and station: The patient has a normal gait. Tandem gait is normal. Romberg is negative. No drift is seen.  Reflexes: Deep tendon reflexes are symmetric.   Assessment/Plan:  1. Primary progressive aphasia  2. Burning discomfort bilateral buttocks  The patient will give a trial off of Lipitor for about 2 or 3 months to see if the burning discomfort in the buttocks improves. If this does not, the patient will contact our office. The patient will be given a trial on gabapentin taking 100 mg 3 times daily, and she will call for dose adjustments of the medication. She will follow-up otherwise in 9 months.  Jill Alexanders MD 09/06/2017 10:04 AM  [facility] [address] [place], [place] [address]  Phone [phone] Fax [phone]

## 2017-10-15 ENCOUNTER — Telehealth: Payer: Self-pay | Admitting: Neurology

## 2017-10-15 MED ORDER — GABAPENTIN 300 MG PO CAPS: 300.0000 mg | ORAL_CAPSULE | Freq: Three times a day (TID) | ORAL | 3 refills | Status: DC

## 2017-10-15 NOTE — Telephone Encounter (Signed)
I called the patient and talked with the husband.  The patient is off the statin drug, on gabapentin 100 mg 3 times daily, still having buttocks pain.  The patient will go up to 200 mg 3 times a day until the 100 mg capsules are used up, then she will convert to 300 mg 3 times daily.  They will call for any further dose adjustments.

## 2017-10-15 NOTE — Addendum Note (Signed)
Addended by: Kathrynn Ducking on: 10/15/2017 06:11 PM   Modules accepted: Orders

## 2017-10-15 NOTE — Telephone Encounter (Signed)
Patient's husband is calling regarding the pain in the patient's buttocks. Dr. Jannifer Franklin prescribed gabapentin (NEURONTIN) 100 MG capsule for this but it has not helped. Is there another medication that can be called to [facility] on [address].

## 2017-12-20 ENCOUNTER — Telehealth: Payer: Self-pay | Admitting: Neurology

## 2017-12-20 NOTE — Telephone Encounter (Signed)
Pt husband(on DPR) has called and states pt's disease progression has increased within the last 2-3 months and he is questioning if this is as a result of the increase in pt's gabapentin (NEURONTIN) 300 MG capsule.  Pt husband also is asking for a call to discuss the idea of pt getting on anxiety medication because of pt being very emotional if husband leaves her for a short period of time(for example).  Please call

## 2017-12-20 NOTE — Telephone Encounter (Signed)
I called the patient.  I talk with the husband.  The patient has had some problems with being a bit more emotional, we could try Lexapro at this time, the patient will be seen within the next month and the husband wants to wait on this.  Patient is having increasing problems with handwriting, this likely is ongoing progression of the primary progressive aphasia.

## 2018-01-11 ENCOUNTER — Other Ambulatory Visit: Payer: Self-pay | Admitting: Internal Medicine

## 2018-01-11 ENCOUNTER — Ambulatory Visit
Admission: RE | Admit: 2018-01-11 | Discharge: 2018-01-11 | Disposition: A | Discharge status: Home / Self Care | Payer: 59 | Source: Ambulatory Visit | Attending: Internal Medicine | Admitting: Internal Medicine

## 2018-01-11 DIAGNOSIS — M7918 Myalgia, other site: Secondary | ICD-10-CM

## 2018-01-11 DIAGNOSIS — C50911 Malignant neoplasm of unspecified site of right female breast: Secondary | ICD-10-CM | POA: Diagnosis not present

## 2018-01-11 DIAGNOSIS — E7800 Pure hypercholesterolemia, unspecified: Secondary | ICD-10-CM | POA: Diagnosis not present

## 2018-01-11 DIAGNOSIS — R7303 Prediabetes: Secondary | ICD-10-CM | POA: Diagnosis not present

## 2018-01-11 DIAGNOSIS — Z1231 Encounter for screening mammogram for malignant neoplasm of breast: Secondary | ICD-10-CM

## 2018-01-11 DIAGNOSIS — M533 Sacrococcygeal disorders, not elsewhere classified: Secondary | ICD-10-CM | POA: Diagnosis not present

## 2018-01-11 DIAGNOSIS — Z23 Encounter for immunization: Secondary | ICD-10-CM | POA: Diagnosis not present

## 2018-02-05 ENCOUNTER — Ambulatory Visit: Payer: 59

## 2018-02-06 ENCOUNTER — Ambulatory Visit
Admission: RE | Admit: 2018-02-06 | Discharge: 2018-02-06 | Disposition: A | Discharge status: Home / Self Care | Payer: Federal, State, Local not specified - PPO | Source: Ambulatory Visit | Attending: Internal Medicine | Admitting: Internal Medicine

## 2018-02-06 DIAGNOSIS — Z1231 Encounter for screening mammogram for malignant neoplasm of breast: Secondary | ICD-10-CM

## 2018-02-09 ENCOUNTER — Other Ambulatory Visit: Payer: Self-pay | Admitting: Neurology

## 2018-02-12 ENCOUNTER — Encounter: Payer: Self-pay | Admitting: Radiology

## 2018-02-12 ENCOUNTER — Emergency Department
Admission: EM | Admit: 2018-02-12 | Discharge: 2018-02-12 | Disposition: A | Discharge status: Home / Self Care | Payer: 59 | Attending: Emergency Medicine | Admitting: Emergency Medicine

## 2018-02-12 ENCOUNTER — Emergency Department: Payer: 59

## 2018-02-12 ENCOUNTER — Other Ambulatory Visit: Payer: Self-pay

## 2018-02-12 DIAGNOSIS — Z79899 Other long term (current) drug therapy: Secondary | ICD-10-CM | POA: Insufficient documentation

## 2018-02-12 DIAGNOSIS — Y9301 Activity, walking, marching and hiking: Secondary | ICD-10-CM | POA: Diagnosis not present

## 2018-02-12 DIAGNOSIS — S40021A Contusion of right upper arm, initial encounter: Secondary | ICD-10-CM | POA: Diagnosis not present

## 2018-02-12 DIAGNOSIS — W19XXXA Unspecified fall, initial encounter: Secondary | ICD-10-CM

## 2018-02-12 DIAGNOSIS — S300XXA Contusion of lower back and pelvis, initial encounter: Secondary | ICD-10-CM | POA: Diagnosis not present

## 2018-02-12 DIAGNOSIS — R4701 Aphasia: Secondary | ICD-10-CM | POA: Insufficient documentation

## 2018-02-12 DIAGNOSIS — Z7982 Long term (current) use of aspirin: Secondary | ICD-10-CM | POA: Insufficient documentation

## 2018-02-12 DIAGNOSIS — W108XXA Fall (on) (from) other stairs and steps, initial encounter: Secondary | ICD-10-CM | POA: Insufficient documentation

## 2018-02-12 DIAGNOSIS — S0990XA Unspecified injury of head, initial encounter: Secondary | ICD-10-CM | POA: Diagnosis not present

## 2018-02-12 DIAGNOSIS — Y929 Unspecified place or not applicable: Secondary | ICD-10-CM | POA: Insufficient documentation

## 2018-02-12 DIAGNOSIS — T148XXA Other injury of unspecified body region, initial encounter: Secondary | ICD-10-CM

## 2018-02-12 DIAGNOSIS — S3992XA Unspecified injury of lower back, initial encounter: Secondary | ICD-10-CM | POA: Diagnosis not present

## 2018-02-12 DIAGNOSIS — Y999 Unspecified external cause status: Secondary | ICD-10-CM | POA: Insufficient documentation

## 2018-02-12 DIAGNOSIS — M25551 Pain in right hip: Secondary | ICD-10-CM | POA: Diagnosis not present

## 2018-02-12 DIAGNOSIS — M542 Cervicalgia: Secondary | ICD-10-CM | POA: Diagnosis not present

## 2018-02-12 DIAGNOSIS — S199XXA Unspecified injury of neck, initial encounter: Secondary | ICD-10-CM | POA: Diagnosis not present

## 2018-02-12 LAB — BASIC METABOLIC PANEL
Anion gap: 10 (ref 5–15)
BUN: 18 mg/dL (ref 6–20)
CO2: 23 mmol/L (ref 22–32)
Calcium: 9.5 mg/dL (ref 8.9–10.3)
Chloride: 104 mmol/L (ref 101–111)
Creatinine, Ser: 0.85 mg/dL (ref 0.44–1.00)
GFR calc Af Amer: >60 mL/min (ref >60)
GFR calc non Af Amer: >60 mL/min (ref >60)
Glucose, Bld: 202 mg/dL — ABNORMAL HIGH (ref 65–99)
POTASSIUM: 4.7 mmol/L (ref 3.5–5.1)
Sodium: 137 mmol/L (ref 135–145)

## 2018-02-12 LAB — CBC
HEMATOCRIT: 38.1 % (ref 35.0–47.0)
Hemoglobin: 12.8 g/dL (ref 12.0–16.0)
MCH: 29.3 pg (ref 26.0–34.0)
MCHC: 33.5 g/dL (ref 32.0–36.0)
MCV: 87.3 fL (ref 80.0–100.0)
Platelets: 405 10*3/uL (ref 150–440)
RBC: 4.37 MIL/uL (ref 3.80–5.20)
RDW: 14.1 % (ref 11.5–14.5)
WBC: 12.3 10*3/uL — ABNORMAL HIGH (ref 3.6–11.0)

## 2018-02-12 MED ORDER — IOPAMIDOL (ISOVUE-300) INJECTION 61%
100.0000 mL | Freq: Once | INTRAVENOUS | Status: AC | PRN
  Administered 2018-02-12: 100 mL via INTRAVENOUS
  Filled 2018-02-12: qty 100

## 2018-02-12 MED ORDER — HYDROCODONE-ACETAMINOPHEN 5-325 MG PO TABS: 1.0000 | ORAL_TABLET | ORAL | 0 refills | Status: DC | PRN

## 2018-02-12 NOTE — ED Provider Notes (Signed)
[facility] Emergency Department Provider Note  Time seen: 11:49 PM  I have reviewed the triage vital signs and the nursing notes.   HISTORY  Chief Complaint Fall    HPI Ann Wright is a 62 y.o. female with a past medical history of breast cancer, primary progressive aphasia, presents to the emergency department after a fall.  According to family and the patient she fell down several steps at around 12 PM today.  Patient had developed increasing pain in her neck and in her buttocks so they brought her to the emergency department for evaluation.  Denies any blood thinners.  Denies loss of consciousness.  Overall patient appears well, ambulates well.   Past Medical History:  Diagnosis Date  . Anxiety   . Cancer ([place]) 1987   Breast - no chemotherapy required  . Complication of anesthesia    "I had a hard time of waking up."  . Diverticulitis    x 2 episodes- none recent  . High cholesterol   . Primary progressive aphasia    limited speech" does understand verbal commands" per spouse  . Primary progressive aphasia     Patient Active Problem List   Diagnosis Date Noted  . Endometrial ca ([place]) 01/19/2016  . 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 Primary progressive aphasia 08/09/2015  . Anxiety 05/08/2014  . Acquired aphasia 05/08/2014  . Dysarthria 05/08/2014  . Balbuties 05/08/2014    Past Surgical History:  Procedure Laterality Date  . BREAST BIOPSY    . BREAST LUMPECTOMY Right 1987   malignant  . COLONOSCOPY    . COLONOSCOPY WITH PROPOFOL N/A 09/12/2016   Procedure: COLONOSCOPY WITH PROPOFOL;  Surgeon: Garlan Fair, MD;  Location: [facility];  Service: Endoscopy;  Laterality: N/A;  . DILITATION & CURRETTAGE/HYSTROSCOPY WITH VERSAPOINT RESECTION N/A 01/03/2016   Procedure: DILATATION & CURETTAGE/HYSTEROSCOPY WITH VERSAPOINT RESECTION;  Surgeon: Princess Bruins, MD;  Location: [facility] [place];  Service: Gynecology;  Laterality: N/A;    Prior to  Admission medications   Medication Sig Start Date End Date Taking? Authorizing Provider  aspirin EC 81 MG tablet Take 81 mg by mouth daily.    [provider]  atorvastatin (LIPITOR) 20 MG tablet Take 20 mg by mouth daily.    [provider]  cholecalciferol (VITAMIN D) 1000 units tablet Take 1,000 Units by mouth daily.    [provider]  gabapentin (NEURONTIN) 300 MG capsule TAKE 1 CAPSULE BY MOUTH THREE TIMES A DAY 02/11/18   Kathrynn Ducking, MD  ibuprofen (ADVIL) 200 MG tablet Take 400 mg by mouth daily as needed for headache or mild pain. Reported on 03/13/2016    [provider]  Loratadine (CLARITIN PO) Take 1 tablet by mouth daily.    [provider]  metFORMIN (GLUCOPHAGE) 500 MG tablet Take by mouth daily with breakfast.    [provider]  Multiple Vitamin (MULTIVITAMIN WITH MINERALS) TABS tablet Take 1 tablet by mouth daily.    [provider]  vitamin B-12 (CYANOCOBALAMIN) 1000 MCG tablet Take 1,000 mcg by mouth daily.    [provider]    No Known Allergies  Family History  Problem Relation Age of Onset  . Heart attack Mother   . Pneumonia Father   . Cancer Sister        breast  . Cancer Sister        breast  . Cancer Daughter        breast    Social  History Social History   Tobacco Use  . Smoking status: Never Smoker  . Smokeless tobacco: Never Used  Substance Use Topics  . Alcohol use: No    Comment: occasional   . Drug use: No    Review of Systems Constitutional: Negative for loss of consciousness Eyes: Negative for visual complaints ENT: Negative for facial pain Cardiovascular: Negative for chest pain. Respiratory: Negative for shortness of breath. Gastrointestinal: Negative for abdominal pain, vomiting Genitourinary: Negative for urinary compaints Musculoskeletal: Mild neck pain.  Moderate right buttock pain Skin: Bruising to the right buttocks Neurological: Negative for  headache All other ROS negative  ____________________________________________   PHYSICAL EXAM:  VITAL SIGNS: ED Triage Vitals [02/12/18 2100]  Enc Vitals Group     BP (!) 142/83     Pulse Rate (!) 108     Resp 20     Temp 98.8 F (37.1 C)     Temp Source Oral     SpO2 98 %     Weight 195 lb (88.5 kg)     Height _0  (1.676 m)     Head Circumference      Peak Flow      Pain Score 8     Pain Loc      Pain Edu?      Excl. in [place]?    Constitutional: Alert. Well appearing and in no distress. Eyes: Normal exam ENT   Head: Normocephalic and atraumatic.   Mouth/Throat: Mucous membranes are moist. Cardiovascular: Normal rate, regular rhythm. No murmur Respiratory: Normal respiratory effort without tachypnea nor retractions. Breath sounds are clear  Gastrointestinal: Soft and nontender. No distention.  Musculoskeletal: Nontender with normal range of motion in all extremities. No lower extremity tenderness or edema.  Ambulates well.  Patient does have a indurated swollen area to the right buttocks with ecchymosis consistent with hematoma Neurologic:  Normal speech and language. No gross focal neurologic deficits  Skin:  Skin is warm, dry.  Ecchymosis to right buttocks. Psychiatric: Mood and affect are normal. Speech and behavior are normal.   ____________________________________________   RADIOLOGY  CT scan shows a large hematoma but no active extravasation.  ____________________________________________   INITIAL IMPRESSION / ASSESSMENT AND PLAN / ED COURSE  Pertinent labs & imaging results that were available during my care of the patient were reviewed by me and considered in my medical decision making (see chart for details).  Patient presents for right buttock pain, neck pain after a fall.  Differential would include hematoma, fracture, neck pain, fracture, cervical strain, intracranial abnormality CT scans of the head and neck are negative.  Lab work is largely  nonrevealing, normal H&H.  CT scan shows a large hematoma to the right buttock but no active extravasation.  Discussed the patient with Dr. Rosana Hoes of general surgery states pain control and follow-up with PCP no surgical intervention required at this time.  Discussed this with the family and patient they are agreeable to this plan of care.  ____________________________________________   FINAL CLINICAL IMPRESSION(S) / ED DIAGNOSES  Fall Hematoma    Harvest Dark, MD 02/12/18 2352

## 2018-02-12 NOTE — ED Triage Notes (Addendum)
Pt fell down about 10 steps and has bruising to right upper arm, but has full rom. Pt has a hx of primary progressive aphasia so she is not able to speak but family states no abnormal behavior noted. Pt is co neck pain, and pain to buttocks. Pt did walk after fall, able to answer yes or no to questions.

## 2018-02-12 NOTE — Discharge Instructions (Signed)
Please follow-up with your primary physician in approximately 1 week for recheck/reevaluation of your right buttock hematoma.  As we discussed please take Tylenol as needed for discomfort as written on the box.  He may take your prescribed Norco if needed for significant pain, please note Norco also contains 325 mg of Tylenol/acetaminophen.  Please limit total acetaminophen use to 4 g per 24-hour period.  Return to the emergency department for any worsening pain, lightheadedness, dizziness, or any other symptom personally concerning to yourself.

## 2018-02-12 NOTE — ED Notes (Signed)
Patient transported to CT 

## 2018-02-14 ENCOUNTER — Ambulatory Visit
Admission: RE | Admit: 2018-02-14 | Discharge: 2018-02-14 | Disposition: A | Discharge status: Home / Self Care | Payer: Federal, State, Local not specified - PPO | Source: Ambulatory Visit | Attending: Internal Medicine | Admitting: Internal Medicine

## 2018-02-14 ENCOUNTER — Other Ambulatory Visit: Payer: Self-pay | Admitting: Internal Medicine

## 2018-02-14 DIAGNOSIS — R519 Headache, unspecified: Secondary | ICD-10-CM

## 2018-02-14 DIAGNOSIS — R51 Headache: Principal | ICD-10-CM

## 2018-02-14 DIAGNOSIS — S0990XA Unspecified injury of head, initial encounter: Secondary | ICD-10-CM

## 2018-02-14 DIAGNOSIS — R1111 Vomiting without nausea: Secondary | ICD-10-CM

## 2018-02-14 DIAGNOSIS — G44319 Acute post-traumatic headache, not intractable: Secondary | ICD-10-CM | POA: Diagnosis not present

## 2018-04-27 IMAGING — CT CT CERVICAL SPINE W/O CM
4 of 7 series · 14 of 33 positions shown, 15 images · non-contrast
Comparison: MRI of the head from 04/08/2013

CLINICAL DATA: Patient fell down 10 steps with bruising and neck
pain. History of primary progressive aphasia.

EXAM:
CT HEAD WITHOUT CONTRAST
CT CERVICAL SPINE WITHOUT CONTRAST
TECHNIQUE: Multidetector CT imaging of the head and cervical spine was
performed following the standard protocol without intravenous
contrast. Multiplanar CT image reconstructions of the cervical spine
were also generated.

[Series 7: c spine soft · axial · 0.29mm/px · z∈[-232,-124]mm · 4 of 91 slices shown]
[im 19/91  soft-tissue]
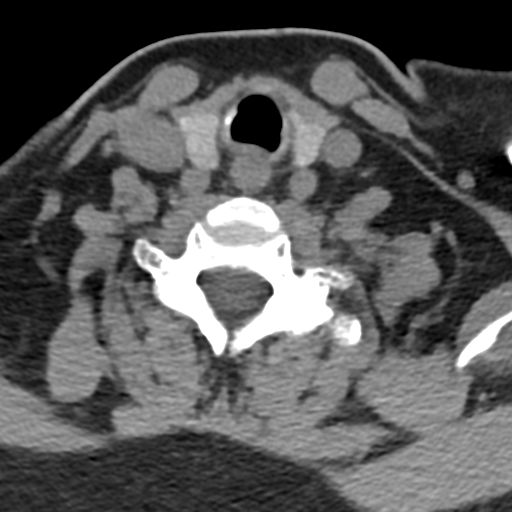
[im 37/91  soft-tissue]
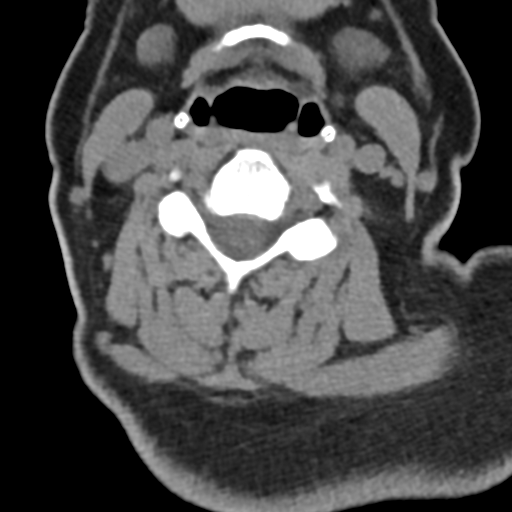
[im 55/91  soft-tissue]
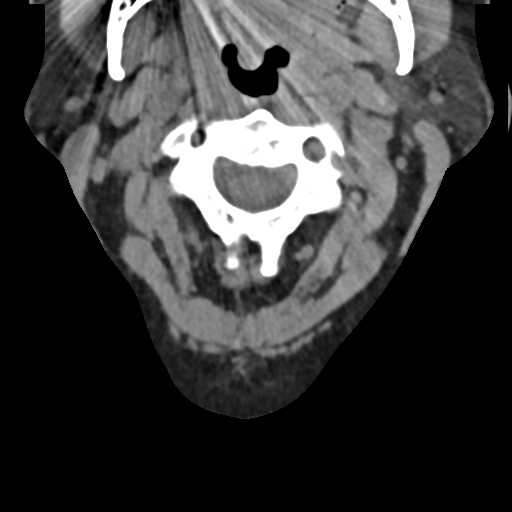
[im 73/91  soft-tissue]
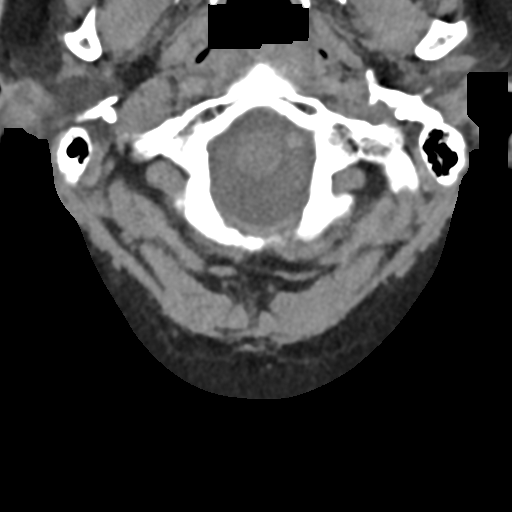

[Series 8: sagittal bone · sagittal · 0.26mm/px · 5 of 58 slices shown]
[im 10/58  bone]
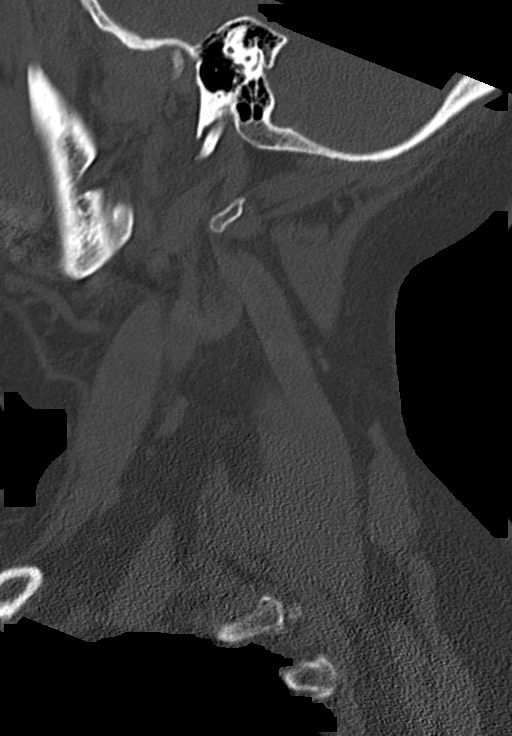
[im 20/58  bone]
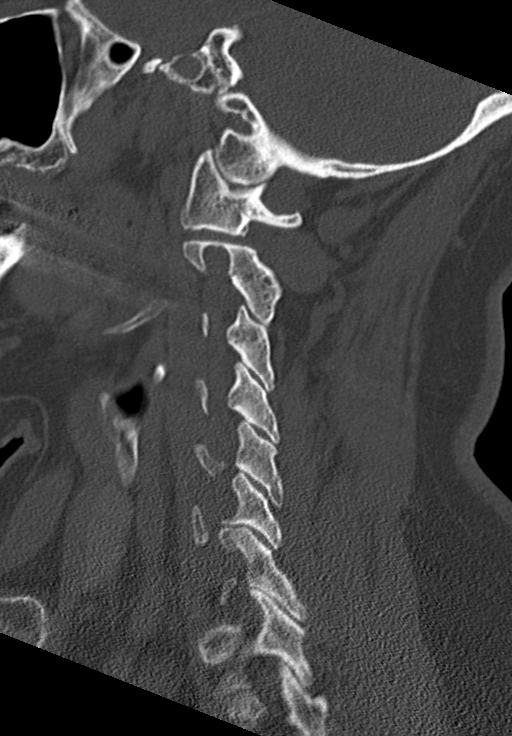
[im 29/58  bone]
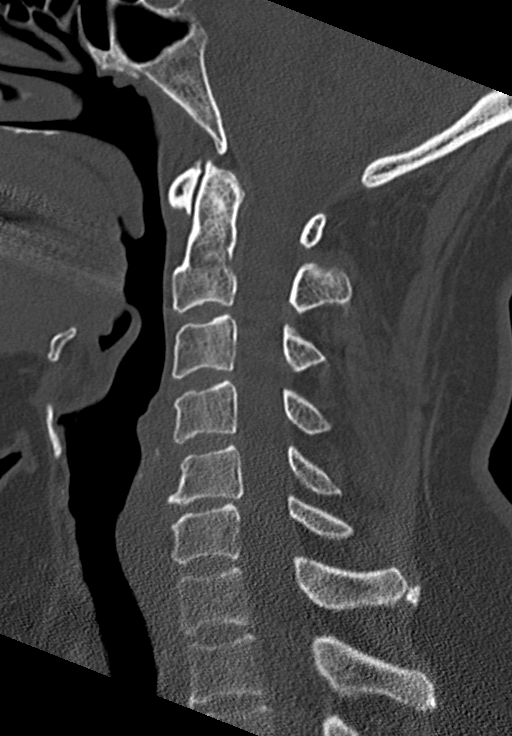
[im 39/58  bone]
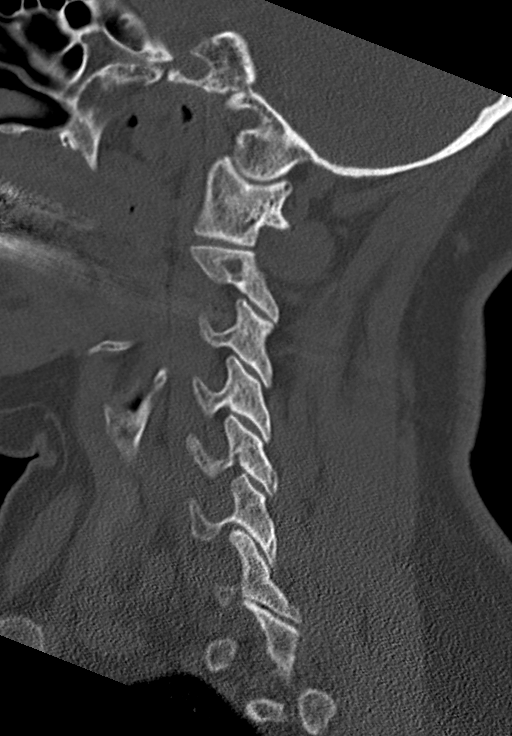
[im 48/58  bone]
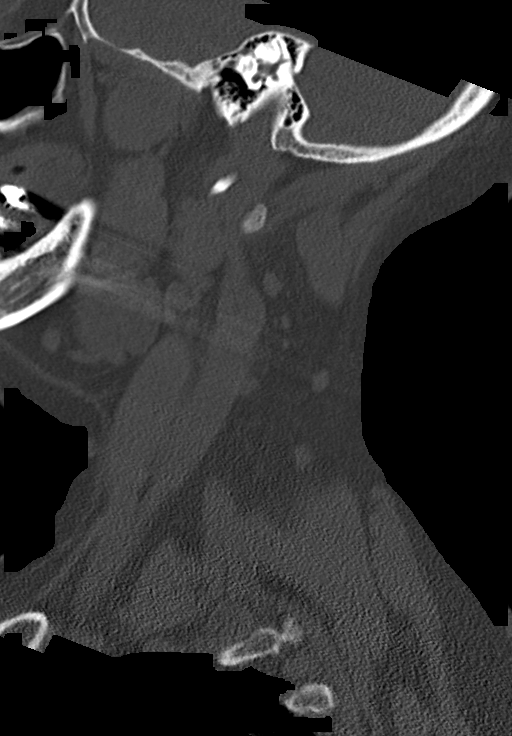

[Series 9: coronal bone · coronal · 0.22mm/px · 1 of 61 slices shown]
[im 31/61  bone]
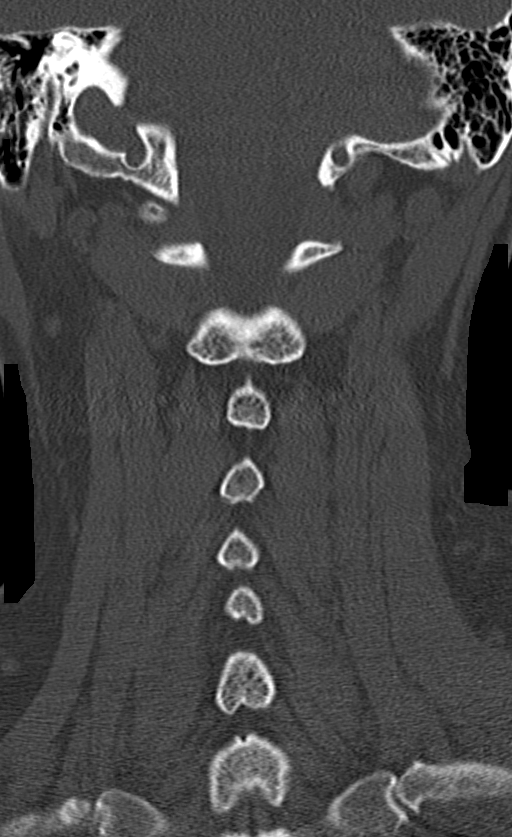

[Series 10: orthogonal bone · axial · 0.23mm/px · z∈[-247,-142]mm · 4 of 95 slices shown, 5 images]
[im 19/95  soft-tissue]
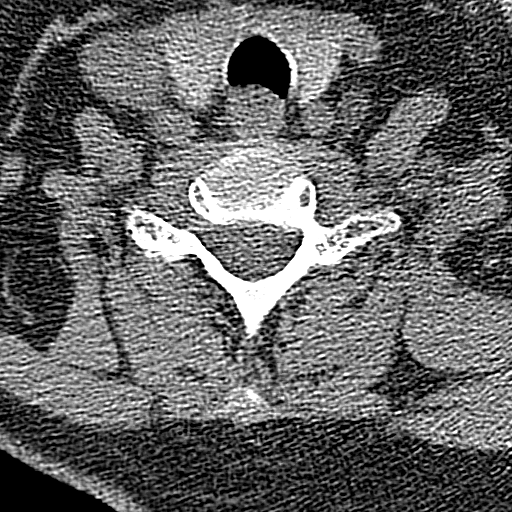
[im 19/95  bone]
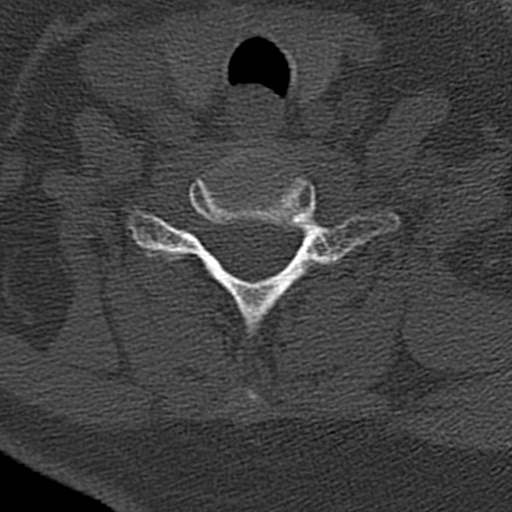
[im 38/95  bone]
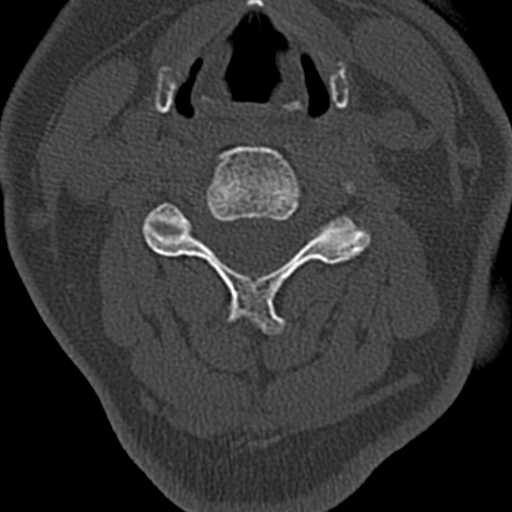
[im 57/95  bone]
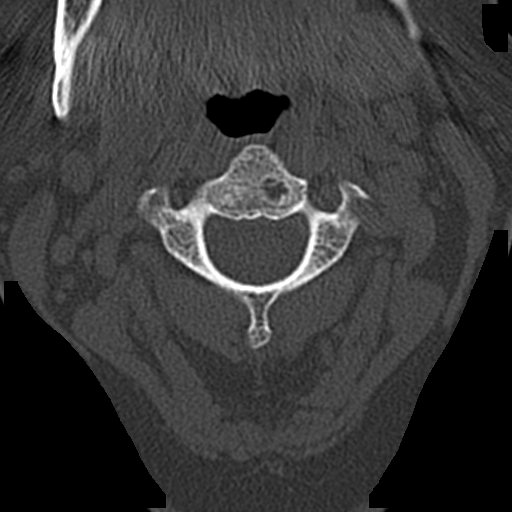
[im 76/95  bone]
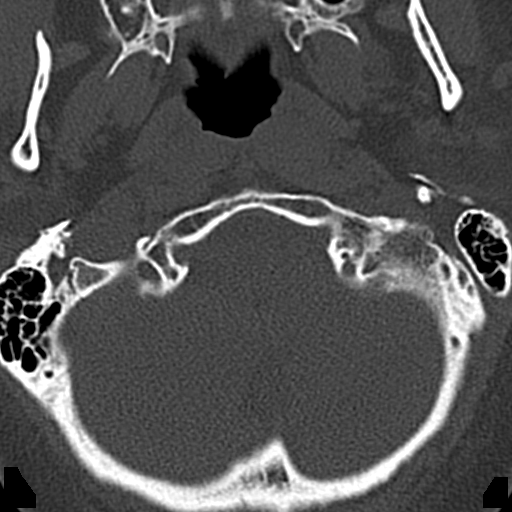

[14 of 33 positions shown; findings below may reference images not displayed]

FINDINGS: CT HEAD FINDINGS

Brain: Mild superficial and central atrophy. Chronic appearing
minimal small vessel ischemic disease of periventricular white
matter. No acute intracranial hemorrhage, large vascular territory
infarct, midline shift or edema. No intra-axial mass nor extra-axial
fluid collections. Midline fourth ventricle and basal cisterns
without effacement.

Vascular: No hyperdense vessels. Minimal atherosclerosis of the
carotid siphons.

Skull: No acute skull fracture or suspicious osseous lesions.

Sinuses/Orbits: Intact orbits and globes. The included paranasal
sinuses and mastoids are clear.

Other: None

CT CERVICAL SPINE FINDINGS

Alignment: Normal.

Skull base and vertebrae: No acute fracture. No primary bone lesion
or focal pathologic process.

Soft tissues and spinal canal: No prevertebral fluid or swelling. No
visible canal hematoma.

Disc levels: Mild disc space narrowing at C5-6 with left-sided
neural foraminal narrowing from uncinate and subarticular endplate
spurring.

Upper chest: Negative

Other: None
IMPRESSION: 1. Mild superficial and central atrophy with chronic minimal small
vessel ischemic disease. No acute intracranial abnormality.
2. Degenerative disc space narrowing at C5-6 with uncinate and
endplate spurring.
3. No acute cervical spine fracture nor posttraumatic listhesis.

## 2018-06-06 ENCOUNTER — Encounter: Payer: Self-pay | Admitting: Neurology

## 2018-06-06 ENCOUNTER — Ambulatory Visit (INDEPENDENT_AMBULATORY_CARE_PROVIDER_SITE_OTHER): Payer: 59 | Admitting: Neurology

## 2018-06-06 VITALS — BP 154/89 | HR 86 | Wt 190.5 lb

## 2018-06-06 DIAGNOSIS — G3101 Pick's disease: Secondary | ICD-10-CM

## 2018-06-06 DIAGNOSIS — F0280 Dementia in other diseases classified elsewhere without behavioral disturbance: Secondary | ICD-10-CM | POA: Diagnosis not present

## 2018-06-06 MED ORDER — MELOXICAM 15 MG PO TABS: 15.0000 mg | ORAL_TABLET | Freq: Every day | ORAL | 1 refills | Status: DC

## 2018-06-06 MED ORDER — HYDROCODONE-ACETAMINOPHEN 5-325 MG PO TABS: 1.0000 | ORAL_TABLET | Freq: Four times a day (QID) | ORAL | 0 refills | Status: DC | PRN

## 2018-06-06 MED ORDER — ESCITALOPRAM OXALATE 10 MG PO TABS: 10.0000 mg | ORAL_TABLET | Freq: Every day | ORAL | 3 refills | Status: DC

## 2018-06-06 NOTE — Progress Notes (Signed)
Reason for visit: Primary progressive aphasia  Ann Wright is an 62 y.o. female  History of present illness:  Ann Wright is a 62 year old right-handed white female with a history of a primary progressive aphasia.  The patient has continued to progress with her ability to talk, she is only able to answer yes and no questions at this point.  The patient still has some understanding of speech, she is able to text on her telephone.  She is having a lot of problems with pain in the buttocks area with sitting, she denies any significant troubles while standing, she can walk and bend and stoop without difficulty.  If she sits in a recliner, she is more comfortable, she is able to sleep on her back at night.  The patient returns to this office for an evaluation.  She did have a fall in February 2019, she sustained a hematoma in the gluteal area.  Review of the CT of the pelvis does show the distal portion of the lumbar spine that appears to be without spinal stenosis.  Past Medical History:  Diagnosis Date  . Anxiety   . Cancer ([place]) 1987   Breast - no chemotherapy required  . Complication of anesthesia    "I had a hard time of waking up."  . Diverticulitis    x 2 episodes- none recent  . High cholesterol   . Primary progressive aphasia    limited speech" does understand verbal commands" per spouse  . Primary progressive aphasia     Past Surgical History:  Procedure Laterality Date  . BREAST BIOPSY    . BREAST LUMPECTOMY Right 1987   malignant  . COLONOSCOPY    . COLONOSCOPY WITH PROPOFOL N/A 09/12/2016   Procedure: COLONOSCOPY WITH PROPOFOL;  Surgeon: Garlan Fair, MD;  Location: [facility];  Service: Endoscopy;  Laterality: N/A;  . DILITATION & CURRETTAGE/HYSTROSCOPY WITH VERSAPOINT RESECTION N/A 01/03/2016   Procedure: DILATATION & CURETTAGE/HYSTEROSCOPY WITH VERSAPOINT RESECTION;  Surgeon: Princess Bruins, MD;  Location: [facility];  Service: Gynecology;  Laterality: N/A;     Family History  Problem Relation Age of Onset  . Heart attack Mother   . Pneumonia Father   . Cancer Sister        breast  . Cancer Sister        breast  . Cancer Daughter        breast    Social history:  reports that she has never smoked. She has never used smokeless tobacco. She reports that she does not drink alcohol or use drugs.   No Known Allergies  Medications:  Prior to Admission medications   Medication Sig Start Date End Date Taking? Authorizing Provider  Acetaminophen (TYLENOL EX ST ARTHRITIS PAIN PO) Take 1 Dose by mouth as needed.   Yes [provider]  aspirin EC 81 MG tablet Take 81 mg by mouth daily.   Yes [provider]  atorvastatin (LIPITOR) 20 MG tablet Take 20 mg by mouth daily.   Yes [provider]  cholecalciferol (VITAMIN D) 1000 units tablet Take 1,000 Units by mouth daily.   Yes [provider]  Loratadine (CLARITIN PO) Take 1 tablet by mouth daily.   Yes [provider]  metFORMIN (GLUCOPHAGE) 500 MG tablet Take by mouth daily with breakfast.   Yes [provider]  Multiple Vitamin (MULTIVITAMIN WITH MINERALS) TABS tablet Take 1 tablet by mouth daily.   Yes [provider]  vitamin B-12 (CYANOCOBALAMIN)  1000 MCG tablet Take 1,000 mcg by mouth daily.   Yes [provider]  escitalopram (LEXAPRO) 10 MG tablet Take 1 tablet (10 mg total) by mouth daily. 06/06/18   Kathrynn Ducking, MD  HYDROcodone-acetaminophen (NORCO/VICODIN) 5-325 MG tablet Take 1 tablet by mouth every 6 (six) hours as needed. 06/06/18   Kathrynn Ducking, MD  meloxicam (MOBIC) 15 MG tablet Take 1 tablet (15 mg total) by mouth daily. 06/06/18   Kathrynn Ducking, MD    ROS:  Out of a complete 14 system review of symptoms, the patient complains only of the following symptoms, and all other reviewed systems are negative.  Incontinence of bladder, frequency of urination Buttock pain Frequent waking,  snoring Memory loss, speech difficulty Agitation, depression  Blood pressure (!) 154/89, pulse 86, weight 190 lb 8 oz (86.4 kg).  Physical Exam  General: The patient is alert and cooperative at the time of the examination.  The patient is mildly obese.  Neuromuscular: Range of movement the lumbar spine is full and normal.  Skin: No significant peripheral edema is noted.   Neurologic Exam  Mental status: The patient is alert and cooperative at the time of the examination.   Cranial nerves: Facial symmetry is present. Speech is very limited, the patient can say yes and no, no fluent speech is noted. Extraocular movements are full. Visual fields are full.  Motor: The patient has good strength in all 4 extremities.  Sensory examination: Soft touch sensation is symmetric on the face, arms, and legs.  Coordination: The patient has good finger-nose-finger and heel-to-shin bilaterally.  Some apraxia with the use of the extremities is noted.  Gait and station: The patient has a normal gait. Tandem gait is normal. Romberg is negative. No drift is seen.  Reflexes: Deep tendon reflexes are symmetric.   Assessment/Plan:  1.  Primary progressive aphasia  2.  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  There is no specific treatment for this.  The patient will be placed on Mobic 15 mg daily for her buttocks pain, she will be given hydrocodone to take if needed.  The patient will follow-up in 8 or 9 months.  The patient will be started on Lexapro as she is having some problems with depression and with crying.  There is a significant level of frustration with her language disorder.  Jill Alexanders MD 06/06/2018 9:38 AM  [facility] [address] [place], [place] [address]  Phone [phone] Fax [phone]

## 2018-06-06 NOTE — Patient Instructions (Signed)
We will use Mobic 15 mg a day for the buttock pain, in place of the advil.

## 2018-06-25 ENCOUNTER — Telehealth: Payer: Self-pay | Admitting: Neurology

## 2018-06-25 ENCOUNTER — Encounter: Payer: Self-pay | Admitting: Neurology

## 2018-06-25 DIAGNOSIS — Z1389 Encounter for screening for other disorder: Secondary | ICD-10-CM | POA: Diagnosis not present

## 2018-06-25 DIAGNOSIS — C50911 Malignant neoplasm of unspecified site of right female breast: Secondary | ICD-10-CM | POA: Diagnosis not present

## 2018-06-25 DIAGNOSIS — Z0000 Encounter for general adult medical examination without abnormal findings: Secondary | ICD-10-CM | POA: Diagnosis not present

## 2018-06-25 NOTE — Telephone Encounter (Addendum)
Pt's husband Michael(DPR) said he is filing for her disability. He needs a letter stating she cannot speak or communicate for herself, it needs her condition or diagnosis. Please mail to him, address confirmed.

## 2018-06-25 NOTE — Telephone Encounter (Signed)
Placed letter in mail as requested.

## 2018-07-17 ENCOUNTER — Telehealth: Payer: Self-pay | Admitting: *Deleted

## 2018-07-17 NOTE — Telephone Encounter (Signed)
Gave completed/signed SS paperwork: physicians/medical officer's statement of patient's capacity to manage benefits form back to medical records to process for pt.

## 2018-07-18 ENCOUNTER — Telehealth: Payer: Self-pay | Admitting: *Deleted

## 2018-07-18 NOTE — Telephone Encounter (Signed)
Pt form mailed out to  social security adm on  07/18/18

## 2018-09-10 DIAGNOSIS — Z23 Encounter for immunization: Secondary | ICD-10-CM | POA: Diagnosis not present

## 2018-09-29 ENCOUNTER — Other Ambulatory Visit: Payer: Self-pay | Admitting: Neurology

## 2018-09-30 ENCOUNTER — Other Ambulatory Visit: Payer: Self-pay | Admitting: Neurology

## 2018-10-21 ENCOUNTER — Telehealth: Payer: Self-pay | Admitting: Neurology

## 2018-10-21 MED ORDER — HYDROCODONE-ACETAMINOPHEN 5-325 MG PO TABS: 1.0000 | ORAL_TABLET | Freq: Four times a day (QID) | ORAL | 0 refills | Status: DC | PRN

## 2018-10-21 NOTE — Addendum Note (Signed)
Addended by: Kathrynn Ducking on: 10/21/2018 04:35 PM   Modules accepted: Orders

## 2018-10-21 NOTE — Telephone Encounter (Signed)
The [facility] registry was checked, the hydrocodone will be refilled.

## 2018-10-21 NOTE — Telephone Encounter (Signed)
Patient's husband Legrand Como (on [place]) requesting refill for HYDROcodone-acetaminophen (NORCO/VICODIN) 5-325 MG tablet sent to [facility] on [address]. He states patient is going out of town.

## 2018-10-29 ENCOUNTER — Other Ambulatory Visit: Payer: Self-pay | Admitting: Neurology

## 2018-11-26 DIAGNOSIS — Z0271 Encounter for disability determination: Secondary | ICD-10-CM

## 2018-12-26 DIAGNOSIS — E7800 Pure hypercholesterolemia, unspecified: Secondary | ICD-10-CM | POA: Diagnosis not present

## 2018-12-26 DIAGNOSIS — R7303 Prediabetes: Secondary | ICD-10-CM | POA: Diagnosis not present

## 2018-12-26 DIAGNOSIS — R269 Unspecified abnormalities of gait and mobility: Secondary | ICD-10-CM | POA: Diagnosis not present

## 2018-12-31 ENCOUNTER — Other Ambulatory Visit: Payer: Self-pay

## 2018-12-31 ENCOUNTER — Emergency Department: Payer: Medicare Other

## 2018-12-31 ENCOUNTER — Emergency Department
Admission: EM | Admit: 2018-12-31 | Discharge: 2018-12-31 | Disposition: A | Discharge status: Home / Self Care | Payer: Medicare Other | Attending: Emergency Medicine | Admitting: Emergency Medicine

## 2018-12-31 ENCOUNTER — Encounter: Payer: Self-pay | Admitting: Emergency Medicine

## 2018-12-31 DIAGNOSIS — Y9289 Other specified places as the place of occurrence of the external cause: Secondary | ICD-10-CM | POA: Insufficient documentation

## 2018-12-31 DIAGNOSIS — W108XXA Fall (on) (from) other stairs and steps, initial encounter: Secondary | ICD-10-CM | POA: Diagnosis not present

## 2018-12-31 DIAGNOSIS — Z7982 Long term (current) use of aspirin: Secondary | ICD-10-CM | POA: Diagnosis not present

## 2018-12-31 DIAGNOSIS — Z853 Personal history of malignant neoplasm of breast: Secondary | ICD-10-CM | POA: Diagnosis not present

## 2018-12-31 DIAGNOSIS — Y9301 Activity, walking, marching and hiking: Secondary | ICD-10-CM | POA: Diagnosis not present

## 2018-12-31 DIAGNOSIS — Y998 Other external cause status: Secondary | ICD-10-CM | POA: Insufficient documentation

## 2018-12-31 DIAGNOSIS — S0990XA Unspecified injury of head, initial encounter: Secondary | ICD-10-CM | POA: Diagnosis not present

## 2018-12-31 DIAGNOSIS — S199XXA Unspecified injury of neck, initial encounter: Secondary | ICD-10-CM | POA: Diagnosis not present

## 2018-12-31 DIAGNOSIS — S0101XA Laceration without foreign body of scalp, initial encounter: Secondary | ICD-10-CM | POA: Diagnosis not present

## 2018-12-31 DIAGNOSIS — Z7984 Long term (current) use of oral hypoglycemic drugs: Secondary | ICD-10-CM | POA: Diagnosis not present

## 2018-12-31 DIAGNOSIS — Z79899 Other long term (current) drug therapy: Secondary | ICD-10-CM | POA: Diagnosis not present

## 2018-12-31 MED ORDER — LIDOCAINE HCL (PF) 1 % IJ SOLN
INTRAMUSCULAR | Status: AC
  Administered 2018-12-31: 5 mL via INTRADERMAL
  Filled 2018-12-31: qty 5

## 2018-12-31 MED ORDER — HYDROCODONE-ACETAMINOPHEN 5-325 MG PO TABS: 1.0000 | ORAL_TABLET | Freq: Four times a day (QID) | ORAL | 0 refills | Status: DC | PRN

## 2018-12-31 MED ORDER — IBUPROFEN 400 MG PO TABS
600.0000 mg | ORAL_TABLET | Freq: Once | ORAL | Status: AC
Start: 2018-12-31 — End: 2018-12-31
  Administered 2018-12-31: 600 mg via ORAL
  Filled 2018-12-31: qty 2

## 2018-12-31 MED ORDER — LIDOCAINE HCL (PF) 1 % IJ SOLN
5.0000 mL | Freq: Once | INTRAMUSCULAR | Status: AC
  Administered 2018-12-31: 5 mL via INTRADERMAL
  Filled 2018-12-31: qty 5

## 2018-12-31 NOTE — ED Provider Notes (Signed)
[facility] Emergency Department Provider Note  ____________________________________________   First MD Initiated Contact with Patient 12/31/18 1607     (approximate)  I have reviewed the triage vital signs and the nursing notes.   HISTORY  Chief Complaint Fall    HPI Ann Wright is a 63 y.o. female with below list of chronic medical conditions presents to the emergency department following accidental fall while going upstairs with resultant occipital head injury.  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  Patient denies any loss of consciousness no complaints at present.  Past Medical History:  Diagnosis Date  . Anxiety   . Cancer ([place]) 1987   Breast - no chemotherapy required  . Complication of anesthesia    "I had a hard time of waking up."  . Diverticulitis    x 2 episodes- none recent  . High cholesterol   . 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 Primary progressive aphasia [facility])     Patient Active Problem List   Diagnosis Date Noted  . Endometrial ca ([place]) 01/19/2016  . 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 Anxiety 05/08/2014  . Acquired aphasia 05/08/2014  . Dysarthria 05/08/2014  . Balbuties 05/08/2014    Past Surgical History:  Procedure Laterality Date  . BREAST BIOPSY    . BREAST LUMPECTOMY Right 1987   malignant  . COLONOSCOPY    . COLONOSCOPY WITH PROPOFOL N/A 09/12/2016   Procedure: COLONOSCOPY WITH PROPOFOL;  Surgeon: Garlan Fair, MD;  Location: [facility];  Service: Endoscopy;  Laterality: N/A;  . DILITATION & CURRETTAGE/HYSTROSCOPY WITH VERSAPOINT RESECTION N/A 01/03/2016   Procedure: DILATATION & CURETTAGE/HYSTEROSCOPY WITH VERSAPOINT  RESECTION;  Surgeon: Princess Bruins, MD;  Location: [place] [facility];  Service: Gynecology;  Laterality: N/A;    Prior to Admission medications   Medication Sig Start Date End Date Taking? Authorizing Provider  Acetaminophen (TYLENOL EX ST ARTHRITIS PAIN PO) Take 1 Dose by mouth as needed.    [provider]  aspirin EC 81 MG tablet Take 81 mg by mouth daily.    [provider]  atorvastatin (LIPITOR) 20 MG tablet Take 20 mg by mouth daily.    [provider]  cholecalciferol (VITAMIN D) 1000 units tablet Take 1,000 Units by mouth daily.    [provider]  escitalopram (LEXAPRO) 10 MG tablet TAKE 1 TABLET BY MOUTH EVERY DAY 09/30/18   Kathrynn Ducking, MD  HYDROcodone-acetaminophen (NORCO/VICODIN) 5-325 MG tablet Take 1 tablet by mouth every 6 (six) hours as needed. 10/21/18   Kathrynn Ducking, MD  Loratadine (CLARITIN PO) Take 1 tablet by mouth daily.    [provider]  meloxicam (MOBIC) 15 MG tablet Take 1 tablet (15 mg total) by mouth daily. 06/06/18   Kathrynn Ducking, MD  metFORMIN (GLUCOPHAGE) 500 MG tablet Take by mouth daily with breakfast.    [provider]  Multiple Vitamin (MULTIVITAMIN WITH MINERALS) TABS tablet Take 1 tablet by mouth daily.    [provider]  vitamin B-12 (CYANOCOBALAMIN) 1000 MCG tablet Take 1,000 mcg by mouth daily.    [provider]    Allergies No known drug allergies  Family History  Problem Relation Age of Onset  . Heart attack Mother   .  Pneumonia Father   . Cancer Sister        breast  . Cancer Sister        breast  . Cancer Daughter        breast    Social History Social History   Tobacco Use  . Smoking status: Never Smoker  . Smokeless tobacco: Never Used  Substance Use Topics  . Alcohol use: No    Comment: occasional   . Drug use: No    Review of Systems Constitutional: No fever/chills Eyes: No visual changes. ENT: No sore throat. Cardiovascular: Denies  chest pain. Respiratory: Denies shortness of breath. Gastrointestinal: No abdominal pain.  No nausea, no vomiting.  No diarrhea.  No constipation. Genitourinary: Negative for dysuria. Musculoskeletal: Negative for neck pain.  Negative for back pain. Integumentary: Negative for rash.  Positive for occipital head injury with laceration  neurological: Negative for headaches, focal weakness or numbness.  ____________________________________________   PHYSICAL EXAM:  VITAL SIGNS: ED Triage Vitals  Enc Vitals Group     BP 12/31/18 1348 (!) 155/88     Pulse Rate 12/31/18 1348 87     Resp 12/31/18 1348 16     Temp 12/31/18 1348 98.4 F (36.9 C)     Temp Source 12/31/18 1348 Oral     SpO2 12/31/18 1348 98 %     Weight --      Height --      Head Circumference --      Peak Flow --      Pain Score 12/31/18 1645 4     Pain Loc --      Pain Edu? --      Excl. in [place]? --     Constitutional: Alert and oriented. Well appearing and in no acute distress. Eyes: Conjunctivae are normal. PERRL. EOMI. Head: 5 cm linear occipital scalp laceration Mouth/Throat: Mucous membranes are moist. Oropharynx non-erythematous. Neck: No stridor.   Cardiovascular: Normal rate, regular rhythm. Good peripheral circulation. Grossly normal heart sounds. Respiratory: Normal respiratory effort.  No retractions. Lungs CTAB. Gastrointestinal: Soft and nontender. No distention.  Musculoskeletal: No lower extremity tenderness nor edema. No gross deformities of extremities. Neurologic:  . No gross focal neurologic deficits are appreciated.  Skin:  Skin is warm, dry and intact. No rash noted.   ____________________________________________   ______________________________  RADIOLOGY Zella Ball, personally viewed and evaluated these images (plain radiographs) as part of my medical decision making, as well as reviewing the written report by the radiologist.  ED MD interpretation: No acute abnormality noted  on CT scan of the head or cervical spine per radiologist.  Official radiology report(s): Ct Head Wo Contrast  Result Date: 12/31/2018 CLINICAL DATA:  Status post fall today. EXAM: CT HEAD WITHOUT CONTRAST CT CERVICAL SPINE WITHOUT CONTRAST TECHNIQUE: Multidetector CT imaging of the head and cervical spine was performed following the standard protocol without intravenous contrast. Multiplanar CT image reconstructions of the cervical spine were also generated. COMPARISON:  Head CT scan 02/14/1999 at head and cervical spine CT scan 02/12/2018. FINDINGS: CT HEAD FINDINGS Brain: No evidence of acute infarction, hemorrhage, hydrocephalus, extra-axial collection or mass lesion/mass effect. Mild atrophy and chronic microvascular ischemic change noted. Vascular: No hyperdense vessel or unexpected calcification. Skull: Normal. Negative for fracture or focal lesion. Sinuses/Orbits: Negative. Other: None. CT CERVICAL SPINE FINDINGS Alignment: Maintained.  Straightening of lordosis is unchanged. Skull base and vertebrae: No acute fracture. No primary bone lesion or focal pathologic process. Soft tissues and  spinal canal: No prevertebral fluid or swelling. No visible canal hematoma. Disc levels: Loss of disc space height and uncovertebral spurring at C5-6 are unchanged. Upper chest: Lung apices are clear. Other: None. IMPRESSION: No acute abnormality head or cervical spine. Mild atrophy and chronic microvascular ischemic change. Mild degenerative disc disease C5-6. Electronically Signed   By: Inge Rise M.D.   On: 12/31/2018 14:19   Ct Cervical Spine Wo Contrast  Result Date: 12/31/2018 CLINICAL DATA:  Status post fall today. EXAM: CT HEAD WITHOUT CONTRAST CT CERVICAL SPINE WITHOUT CONTRAST TECHNIQUE: Multidetector CT imaging of the head and cervical spine was performed following the standard protocol without intravenous contrast. Multiplanar CT image reconstructions of the cervical spine were also generated.  COMPARISON:  Head CT scan 02/14/1999 at head and cervical spine CT scan 02/12/2018. FINDINGS: CT HEAD FINDINGS Brain: No evidence of acute infarction, hemorrhage, hydrocephalus, extra-axial collection or mass lesion/mass effect. Mild atrophy and chronic microvascular ischemic change noted. Vascular: No hyperdense vessel or unexpected calcification. Skull: Normal. Negative for fracture or focal lesion. Sinuses/Orbits: Negative. Other: None. CT CERVICAL SPINE FINDINGS Alignment: Maintained.  Straightening of lordosis is unchanged. Skull base and vertebrae: No acute fracture. No primary bone lesion or focal pathologic process. Soft tissues and spinal canal: No prevertebral fluid or swelling. No visible canal hematoma. Disc levels: Loss of disc space height and uncovertebral spurring at C5-6 are unchanged. Upper chest: Lung apices are clear. Other: None. IMPRESSION: No acute abnormality head or cervical spine. Mild atrophy and chronic microvascular ischemic change. Mild degenerative disc disease C5-6. Electronically Signed   By: Inge Rise M.D.   On: 12/31/2018 14:19     .Marland KitchenLaceration Repair Date/Time: 12/31/2018 6:16 PM Performed by: Gregor Hams, MD Authorized by: Gregor Hams, MD   Consent:    Consent obtained:  Verbal   Consent given by:  Patient   Risks discussed:  Infection, pain, retained foreign body, poor cosmetic result and poor wound healing Anesthesia (see MAR for exact dosages):    Anesthesia method:  Local infiltration   Local anesthetic:  Lidocaine 1% w/o epi Laceration details:    Location:  Scalp   Scalp location:  Occipital   Length (cm):  5 Repair type:    Repair type:  Simple Exploration:    Hemostasis achieved with:  Direct pressure   Wound exploration: entire depth of wound probed and visualized     Contaminated: no   Treatment:    Area cleansed with:  Saline   Amount of cleaning:  Extensive   Irrigation solution:  Sterile saline   Visualized foreign  bodies/material removed: no   Skin repair:    Repair method:  Sutures   Suture size:  4-0   Suture material:  Nylon   Suture technique:  Simple interrupted Approximation:    Approximation:  Close Post-procedure details:    Dressing:  Sterile dressing   Patient tolerance of procedure:  Tolerated well, no immediate complications     ____________________________________________   INITIAL IMPRESSION / ASSESSMENT AND PLAN / ED COURSE  As part of my medical decision making, I reviewed the following data within the electronic MEDICAL RECORD NUMBER   63 year old female presented with above-stated history and physical exam secondary accidental fall resultant scalp laceration that was repaired without difficulty. ____________________________________________  FINAL CLINICAL IMPRESSION(S) / ED DIAGNOSES  Final diagnoses:  Laceration of scalp, initial encounter     MEDICATIONS GIVEN DURING THIS VISIT:  Medications  lidocaine (PF) (XYLOCAINE) 1 % injection  5 mL (has no administration in time range)  ibuprofen (ADVIL,MOTRIN) tablet 600 mg (600 mg Oral Given 12/31/18 1638)     ED Discharge Orders    None       Note:  This document was prepared using Dragon voice recognition software and may include unintentional dictation errors.    Gregor Hams, MD 12/31/18 1816

## 2018-12-31 NOTE — ED Notes (Signed)
Pt unable to sign because of topaz pad freezing on discharge screen. All discharge information sent with pt and significant other.

## 2018-12-31 NOTE — ED Triage Notes (Signed)
Pt with spouse, has unwitnessed fall today. PT hx of aphasia. Pt not on any blood thinners. VSS. Lac noted to back of head, bleeding steadily. Gauze applied and spouse holding pressure.

## 2019-01-09 ENCOUNTER — Other Ambulatory Visit: Payer: Self-pay | Admitting: Neurology

## 2019-01-09 ENCOUNTER — Other Ambulatory Visit: Payer: Self-pay | Admitting: Internal Medicine

## 2019-01-09 DIAGNOSIS — Z1231 Encounter for screening mammogram for malignant neoplasm of breast: Secondary | ICD-10-CM

## 2019-01-09 DIAGNOSIS — S0101XD Laceration without foreign body of scalp, subsequent encounter: Secondary | ICD-10-CM | POA: Diagnosis not present

## 2019-01-09 DIAGNOSIS — Z4802 Encounter for removal of sutures: Secondary | ICD-10-CM | POA: Diagnosis not present

## 2019-01-15 ENCOUNTER — Ambulatory Visit: Payer: Medicare Other | Admitting: Physical Therapy

## 2019-01-23 ENCOUNTER — Other Ambulatory Visit: Payer: Self-pay

## 2019-01-23 ENCOUNTER — Ambulatory Visit: Payer: Medicare Other | Attending: Internal Medicine | Admitting: Rehabilitation

## 2019-01-23 ENCOUNTER — Encounter: Payer: Self-pay | Admitting: Rehabilitation

## 2019-01-23 DIAGNOSIS — R2681 Unsteadiness on feet: Secondary | ICD-10-CM | POA: Diagnosis not present

## 2019-01-23 DIAGNOSIS — R296 Repeated falls: Secondary | ICD-10-CM | POA: Insufficient documentation

## 2019-01-23 DIAGNOSIS — M6281 Muscle weakness (generalized): Secondary | ICD-10-CM

## 2019-01-23 NOTE — Therapy (Signed)
[facility] [address] [place], [place], [address] Phone: [phone]   Fax:  [phone]  Physical Therapy Evaluation  Patient Details  Name: Ann Wright MRN: [id number] Date of Birth: 03/19/1956 Referring Provider (PT): Wenda Low, MD   Encounter Date: 01/23/2019  PT End of Session - 01/23/19 1255    Visit Number  1    Number of Visits  9    Date for PT Re-Evaluation  03/24/19   only plan to see for 30 days max   Authorization Type  Medicare, Federal BCBS-needs progress note every 10th visit.     PT Start Time  [phone]    PT Stop Time  1015    PT Time Calculation (min)  42 min    Activity Tolerance  Patient tolerated treatment well    Behavior During Therapy  Flat affect       Past Medical History:  Diagnosis Date  . Anxiety   . Cancer ([place]) 1987   Breast - no chemotherapy required  . Complication of anesthesia    "I had a hard time of waking up."  . Diverticulitis    x 2 episodes- none recent  . High cholesterol   . 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 Primary progressive aphasia [facility])     Past Surgical History:  Procedure Laterality Date  . BREAST BIOPSY    . BREAST LUMPECTOMY Right 1987   malignant  . COLONOSCOPY    . COLONOSCOPY WITH PROPOFOL N/A 09/12/2016   Procedure: COLONOSCOPY WITH PROPOFOL;  Surgeon: Garlan Fair, MD;  Location: [facility];  Service: Endoscopy;  Laterality: N/A;  . DILITATION & CURRETTAGE/HYSTROSCOPY WITH VERSAPOINT RESECTION N/A 01/03/2016   Procedure: DILATATION & CURETTAGE/HYSTEROSCOPY WITH VERSAPOINT RESECTION;  Surgeon: Princess Bruins, MD;  Location: [place] [facility];  Service: Gynecology;  Laterality: N/A;    There were no vitals filed for this visit.   Subjective Assessment - 01/23/19 0938    Subjective  Per husband, "She is falling more and more.  It is always backwards.  Its like she freezes and  then falls, especially if she has something in her hands."     Patient is accompained by:  Family member   Ann Wright, husband   Pertinent History  Primary Progressive Aphasia (April 2014-is mostly non verbal at this time, but does have some receptive ability-per husband).     Limitations  Walking    Patient Stated Goals  "to continue to walk as long as possible." Per husband    Currently in Pain?  No/denies         [facility] PT Assessment - 01/23/19 1443      Assessment   Medical Diagnosis  Balance/falls    Referring Provider (PT)  Wenda Low, MD    Onset Date/Surgical Date  --   Since first fall approx 1 year ago   Prior Therapy  OP SLP approx 3 years ago      Precautions   Precautions  Fall    Precaution Comments  Primary progressive aphasia      Balance Screen   Has the patient fallen in the past 6 months  Yes    How many times?  2    Has the patient had a decrease in activity level because of a fear of falling?   Yes    Is the patient reluctant to leave their home because of a fear of  falling?   Yes      [name]  Private residence    Living Arrangements  Spouse/significant other    Available Help at Discharge  Family;Available 24 hours/day   son and looking into caregivers    Type of [place] to enter    Entrance Stairs-Number of Steps  2    Entrance Stairs-Rails  None    Home Layout  Two level;Bed/bath upstairs    Alternate Level Stairs-Number of Steps  10    Alternate Level Stairs-Rails  Right    Home Equipment  Walker - 2 wheels;Shower seat   has not used either      Prior Function   Level of Independence  Independent with basic ADLs   does laundry, gets snacks, gets help for getting pants on    Vocation  Retired    Leisure  Wants to be as active as possible       Charity fundraiser Status  Within Functional Limits for tasks assessed      Sensation   Light Touch  Appears Intact    Hot/Cold   Appears Intact      Coordination   Gross Motor Movements are Fluid and Coordinated  Yes    Fine Motor Movements are Fluid and Coordinated  Yes    Heel Shin Test  Difficult following commands, seems grossly intact       ROM / Strength   AROM / PROM / Strength  Strength      Strength   Overall Strength  Unable to assess;Due to impaired cognition    Overall Strength Comments  Pt with difficulty following commands      Transfers   Transfers  Sit to Stand;Stand to Sit    Sit to Stand  6: Modified independent (Device/Increase time)    Five time sit to stand comments   35.25 secs with and without UE support, difficult to accurately assess due to aphasia    Stand to Sit  6: Modified independent (Device/Increase time)      Ambulation/Gait   Ambulation/Gait  Yes    Ambulation/Gait Assistance  4: Min guard;4: Min assist   to guide   Ambulation/Gait Assistance Details  Pt somewhat impulsive with gait, esp when moving in ADL Kitchen and almost catches foot on leg of chair.      Ambulation Distance (Feet)  200 Feet    Assistive device  None    Gait Pattern  Step-through pattern;Trunk flexed    Ambulation Surface  Level;Indoor    Gait velocity  3.43 ft/sec without device, min/guard to guide    Stairs  Yes    Stairs Assistance  4: Min guard;4: Min assist    Stairs Assistance Details (indicate cue type and reason)  Pt ascends well with B rails, however when descending, she performed sideways and at times crossed feet over, increasing fall risk.     Stair Management Technique  Two rails;Alternating pattern;Step to pattern;Forwards;Sideways    Number of Stairs  4    Height of Stairs  6      Balance   Balance Assessed  Yes      High Level Balance   High Level Balance Comments  Attempted to assess balance, however this was very difficult due to receptive deficits.  Had pt stand with feet apart and EC (only for brief time) without LOB, feet together EO (pt unable to maintain  feet together and  needs assist to get feet together) but no LOB.  Had pt stand on foam airex with feet apart without LOB, feet apart EC with minimal postural sway,  Attempted feet together on foam, but again needs assist and doesn't maintain long enough to assess, but did seem to have difficulty.                 Objective measurements completed on examination: See above findings.              PT Education - 01/23/19 1254    Education Details  goals of therapy being compensatory strategies and education for family due to progressive nature of disease and current status, POC    Person(s) Educated  Patient;Spouse    Methods  Explanation    Comprehension  Verbalized understanding       PT Short Term Goals - 01/23/19 1304      PT SHORT TERM GOAL #1   Title  =LTGs        PT Long Term Goals - 01/23/19 1304      PT LONG TERM GOAL #1   Title  Family/caregivers will verbalize understanding of fall prevention strategies at home to reduce pts fall risk.  (Target Date: 02/22/19)    Time  4    Period  Weeks    Status  New    Target Date  02/22/19      PT LONG TERM GOAL #2   Title  Pt will perform floor transfer with little UE support in order to indicate safety at home in case of fall and improved LE strength.      Time  4    Period  Weeks    Status  New      PT LONG TERM GOAL #3   Title  Pt will perform 12 steps with single rail at close S level with improved ability to keep weight forward to prevent LOB.      Time  4    Period  Weeks    Status  New      PT LONG TERM GOAL #4   Title  Husband will verbalize completion of plans to hire intermittent caregivers to provide husband respite care.     Time  4    Period  Weeks    Status  New             Plan - 01/23/19 1257    Clinical Impression Statement  Pt presents with diagnosis of primary progressive aphasia with recent increase in falls (always posteriorly) in the last year.  Note remote history of cancer and also anxiety.   During PT evaluation, it was very difficult to formally assess pt as she has extreme receptive deficits (along with expressive), however pt seems to have no posterior stepping strategy during falls.  Note that she also had mild difficulty on compliant surfaces, but unable to fully get pt in appropriate positions to challenge her.  Discussed with pt and husband that goals of therapy would likely be compensatory and education to family/caregiver due to deficits from aphasia.  Pts husband also notes that they are in process of moving to [place] to a one level home and may only be here for the next 2-3 weeks.  Will see for short period of PT to teach safety strategies and compensation for pts deficits.     History and Personal Factors relevant to plan of care:  see above  Clinical Presentation  Evolving    Clinical Presentation due to:  see above    Clinical Decision Making  Moderate    Rehab Potential  Fair    Clinical Impairments Affecting Rehab Potential  significance of language/cognitive deficits     PT Frequency  2x / week    PT Duration  4 weeks    PT Treatment/Interventions  ADLs/Self Care Home Management;DME Instruction;Functional mobility training;Therapeutic activities;Therapeutic exercise;Balance training;Patient/family education;Cognitive remediation    PT Next Visit Plan  Trial floor transfer (I would do in a private room) to ensure safety if fall at home, fall prevention strategies to husband/caregiver, work on stairs-see if we can keep her hands forward to prevent posterior LOB, try some some of posterior stepping strategy??    Consulted and Agree with Plan of Care  Patient;Family member/caregiver    Family Member Consulted  Husband Ann Wright        Patient will benefit from skilled therapeutic intervention in order to improve the following deficits and impairments:  Decreased cognition, Decreased knowledge of precautions, Decreased knowledge of use of DME, Decreased safety awareness,  Decreased strength, Postural dysfunction, Improper body mechanics  Visit Diagnosis: Unsteadiness on feet  Repeated falls  Muscle weakness (generalized)     Problem List Patient Active Problem List   Diagnosis Date Noted  . Endometrial ca ([place]) 01/19/2016  . 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 Anxiety 05/08/2014  . Acquired aphasia 05/08/2014  . Dysarthria 05/08/2014  . Balbuties 05/08/2014    Cameron Sprang, PT, MPT [facility] [address] [place], [place], [address] Phone: [phone]   Fax:  [phone] 01/23/19, 1:09 PM  Name: ARAH ARO MRN: [id number] Date of Birth: September 02, 1956

## 2019-02-06 ENCOUNTER — Encounter: Payer: Self-pay | Admitting: Neurology

## 2019-02-06 ENCOUNTER — Ambulatory Visit (INDEPENDENT_AMBULATORY_CARE_PROVIDER_SITE_OTHER): Payer: Medicare Other | Admitting: Neurology

## 2019-02-06 VITALS — BP 142/82 | HR 83 | Ht 66.0 in | Wt 182.0 lb

## 2019-02-06 DIAGNOSIS — R269 Unspecified abnormalities of gait and mobility: Secondary | ICD-10-CM | POA: Diagnosis not present

## 2019-02-06 DIAGNOSIS — F0280 Dementia in other diseases classified elsewhere without behavioral disturbance: Secondary | ICD-10-CM

## 2019-02-06 DIAGNOSIS — G3101 Pick's disease: Secondary | ICD-10-CM

## 2019-02-06 HISTORY — DX: Unspecified abnormalities of gait and mobility: R26.9

## 2019-02-06 NOTE — Progress Notes (Signed)
Reason for visit: Primary progressive aphasia, gait disturbance  Ann Wright is an 63 y.o. female  History of present illness:  Ann Wright is a 63 year old right-handed white female with a history of primary progressive aphasia.  The patient has had a significant decline in her verbal capacity since last seen, she is essentially mute at this point, she does understand some verbal cues.  She has developed some problems with gait instability, she will tend to fall backwards, particularly when going upstairs.  She has developed apraxias, she needs some help with bathing and dressing.  She will sometimes freeze when she tries to go upstairs.  She may have difficulty with visuospatial apraxia, she may partially miss a chair when she tries to sit down.  She is still able to use the remote for the TV, she is not using her telephone as much.  The patient returns to this office for an evaluation.  She has been set up for physical therapy for gait training.  Past Medical History:  Diagnosis Date  . Anxiety   . Cancer ([place]) 1987   Breast - no chemotherapy required  . Complication of anesthesia    "I had a hard time of waking up."  . Diverticulitis    x 2 episodes- none recent  . High cholesterol   . Primary progressive aphasia ([place])    limited speech" does understand verbal commands" per spouse  . Primary progressive aphasia [facility])     Past Surgical History:  Procedure Laterality Date  . BREAST BIOPSY    . BREAST LUMPECTOMY Right 1987   malignant  . COLONOSCOPY    . COLONOSCOPY WITH PROPOFOL N/A 09/12/2016   Procedure: COLONOSCOPY WITH PROPOFOL;  Surgeon: Garlan Fair, MD;  Location: [facility];  Service: Endoscopy;  Laterality: N/A;  . DILITATION & CURRETTAGE/HYSTROSCOPY WITH VERSAPOINT RESECTION N/A 01/03/2016   Procedure: DILATATION & CURETTAGE/HYSTEROSCOPY WITH VERSAPOINT RESECTION;  Surgeon: Princess Bruins, MD;  Location: [facility];  Service: Gynecology;  Laterality: N/A;     Family History  Problem Relation Age of Onset  . Heart attack Mother   . Pneumonia Father   . Cancer Sister        breast  . Cancer Sister        breast  . Cancer Daughter        breast    Social history:  reports that she has never smoked. She has never used smokeless tobacco. She reports that she does not drink alcohol or use drugs.   No Known Allergies  Medications:  Prior to Admission medications   Medication Sig Start Date End Date Taking? Authorizing Provider  Acetaminophen (TYLENOL EX ST ARTHRITIS PAIN PO) Take 1 Dose by mouth as needed.   Yes [provider]  aspirin EC 81 MG tablet Take 81 mg by mouth daily.   Yes [provider]  atorvastatin (LIPITOR) 20 MG tablet Take 20 mg by mouth daily.   Yes [provider]  cholecalciferol (VITAMIN D) 1000 units tablet Take 1,000 Units by mouth daily.   Yes [provider]  escitalopram (LEXAPRO) 10 MG tablet TAKE 1 TABLET BY MOUTH EVERY DAY 09/30/18  Yes Kathrynn Ducking, MD  HYDROcodone-acetaminophen (NORCO/VICODIN) 5-325 MG tablet Take 1 tablet by mouth every 6 (six) hours as needed. 12/31/18  Yes Gregor Hams, MD  Loratadine (CLARITIN PO) Take 1 tablet by mouth daily.   Yes [provider]  meloxicam (MOBIC) 15 MG tablet TAKE 1  TABLET BY MOUTH EVERY DAY 01/09/19  Yes Kathrynn Ducking, MD  metFORMIN (GLUCOPHAGE) 500 MG tablet Take by mouth daily with breakfast.   Yes [provider]  Multiple Vitamin (MULTIVITAMIN WITH MINERALS) TABS tablet Take 1 tablet by mouth daily.   Yes [provider]  vitamin B-12 (CYANOCOBALAMIN) 1000 MCG tablet Take 1,000 mcg by mouth daily.   Yes [provider]    ROS:  Out of a complete 14 system review of symptoms, the patient complains only of the following symptoms, and all other reviewed systems are negative.  Frequent waking Walking difficulty Memory loss, speech difficulty  Blood pressure (!) 142/82, pulse  83, height 5\' 6"  (1.676 m), weight 182 lb (82.6 kg), SpO2 95 %.  Physical Exam  General: The patient is alert and cooperative at the time of the examination.  Skin: No significant peripheral edema is noted.   Neurologic Exam  Mental status: The patient is alert and oriented x 3 at the time of the examination. The patient has apparent normal recent and remote memory, with an apparently normal attention span and concentration ability.   Cranial nerves: Facial symmetry is present.  The patient is mute. Extraocular movements are full. Visual fields are full.  Motor: The patient has good strength in all 4 extremities.  Sensory examination: Soft touch sensation is symmetric on the face, arms, and legs.  Coordination: The patient has significant apraxia trying to perform finger-nose-finger and heel shin on both sides.  Gait and station: The patient has a normal gait, she has good stride and good turns.  Tandem gait was not attempted.  Romberg is negative.  Reflexes: Deep tendon reflexes are symmetric.   Assessment/Plan:  1.  Primary progressive aphasia  2.  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  She will tend to fall backwards if she stoops down to pick up something or if she is going up stairs, walking on the flat and level is not difficult for her.  The patient will be undergoing physical therapy.  The issue with apraxia is not treatable, this will likely worsen over time.  She will follow-up in 6 months.  Jill Alexanders MD 02/06/2019 10:19 AM  [facility] [address] [place], [place] [address]  Phone [phone] Fax [phone]

## 2019-02-10 ENCOUNTER — Ambulatory Visit
Admission: RE | Admit: 2019-02-10 | Discharge: 2019-02-10 | Disposition: A | Discharge status: Home / Self Care | Payer: Medicare Other | Source: Ambulatory Visit | Attending: Internal Medicine | Admitting: Internal Medicine

## 2019-02-10 DIAGNOSIS — Z1231 Encounter for screening mammogram for malignant neoplasm of breast: Secondary | ICD-10-CM

## 2019-02-11 ENCOUNTER — Encounter: Payer: Self-pay | Admitting: Physical Therapy

## 2019-02-11 ENCOUNTER — Ambulatory Visit: Payer: Medicare Other | Attending: Internal Medicine | Admitting: Physical Therapy

## 2019-02-11 DIAGNOSIS — M6281 Muscle weakness (generalized): Secondary | ICD-10-CM | POA: Diagnosis present

## 2019-02-11 DIAGNOSIS — R2681 Unsteadiness on feet: Secondary | ICD-10-CM | POA: Diagnosis not present

## 2019-02-11 DIAGNOSIS — R296 Repeated falls: Secondary | ICD-10-CM | POA: Insufficient documentation

## 2019-02-11 NOTE — Therapy (Signed)
[facility] [address] [place], [place], [address] Phone: [phone]   Fax:  [phone]  Physical Therapy Treatment  Patient Details  Name: Ann Wright MRN: [id number] Date of Birth: 1956/01/22 Referring Provider (PT): Wenda Low, MD   Encounter Date: 02/11/2019  PT End of Session - 02/11/19 0814    Visit Number  2    Number of Visits  9    Date for PT Re-Evaluation  03/24/19   only plan to see for 30 days max   Authorization Type  Medicare, Federal BCBS-needs progress note every 10th visit.     PT Start Time  0830    PT Stop Time  0920    PT Time Calculation (min)  50 min    Equipment Utilized During Treatment  Gait belt    Activity Tolerance  Patient tolerated treatment well    Behavior During Therapy  Flat affect       Past Medical History:  Diagnosis Date  . Anxiety   . Cancer ([place]) 1987   Breast - no chemotherapy required  . Complication of anesthesia    "I had a hard time of waking up."  . Diverticulitis    x 2 episodes- none recent  . Gait abnormality 02/06/2019  . High cholesterol   . 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 Primary progressive aphasia [facility])     Past Surgical History:  Procedure Laterality Date  . BREAST BIOPSY    . BREAST LUMPECTOMY Right 1987   malignant  . COLONOSCOPY    . COLONOSCOPY WITH PROPOFOL N/A 09/12/2016   Procedure: COLONOSCOPY WITH PROPOFOL;  Surgeon: Garlan Fair, MD;  [facility];  Service: Endoscopy;  Laterality: N/A;  . DILITATION & CURRETTAGE/HYSTROSCOPY WITH VERSAPOINT RESECTION N/A 01/03/2016   Procedure: DILATATION & CURETTAGE/HYSTEROSCOPY WITH VERSAPOINT RESECTION;  Surgeon: Princess Bruins, MD;  Location: [facility];  Service: Gynecology;  Laterality: N/A;    There were no vitals filed for this visit.  Subjective Assessment - 02/11/19 0832    Subjective  No changes. No  falls. Husband reports he is with her at all times. No near falls. Only going up and down steps one time per day.     Patient is accompained by:  Family member   [name], husband   Pertinent History  Primary Progressive Aphasia (April 2014-is mostly non verbal at this time, but does have some receptive ability-per husband).     Limitations  Walking    Patient Stated Goals  "to continue to walk as long as possible." Per husband    Currently in Pain?  No/denies                       [facility] Adult PT Treatment/Exercise - 02/11/19 0925      Transfers   Transfers  Floor to Transfer    Floor to Transfer  3: Mod assist;From bed    Floor to Transfer Details (indicate cue type and reason)  incr assist to guide/facilitate desired sequence as pt cannot follow verbal or visual cues; x2 reps going thru quadruped, to 1/2 kneeling, hands reaching forward on bed to shift forward and come to stand; attempted to have pt keep hands on bed for forward wtshift, however she quickly stands fully upright; attempted to have pt with back to bed and one hand on bed (thru shoulder extension) and push up to sit  on bed (with back to bed due to pt's tendency to lean/push posteriorly (however she was unable to sequence/complete this technique)    Comments  simulated car transfer with pt sitting down first, then lifting feet into the car (per husband, pt tries to step one foot into car while standing--often the foot farthest from the car--crossing over).       Ambulation/Gait   Ambulation/Gait Assistance  4: Min guard    Ambulation/Gait Assistance Details  no freezing of gait noted today. Educated husband on trying rocking left<>right and then step forward to work out of a freeze. He reports freezing most often happens on stairs    Ambulation Distance (Feet)  100 Feet   120   Assistive device  None    Gait Pattern  Step-through pattern;Trunk flexed;Wide base of support    Ambulation Surface  Level;Indoor     Stairs  Yes    Stairs Assistance  4: Min guard;4: Min assist    Stairs Assistance Details (indicate cue type and reason)  attempted to have pt reach farther up rail to keep weight forward, however pt not understanding/stops and husband afraid pt will freeze more; he has begun walking up behind her with hand between her shoulder blades with light pressure forward to help her "keep moving" so she does not freeze and this worked well for pt; going down he walks down in front of her (she puts hand on rail and other hand on his shoulder as they simultaneously walk down facing forwards    Stair Management Technique  One rail Right;Alternating pattern;Forwards;Other (comment)   see above   Number of Stairs  4   x4   Height of Stairs  6      Exercises   Exercises  Other Exercises    Other Exercises   ankle DF with knee extended--rt ankle to neutral only, left ankle ~5-10 degrees DF; attempted standing with forefoot on 2"block (pt kept moving foot off block/out of stretch); attempted bil hands on counter and walk both feet backwards to stretch bil heel cords;ultimately did best seated with back support, leg extended over stool with strap around foot and pulling into DF (husband also tried sitting across from her and putting the bottom of his foot to the bottom of hers and pushing her into DF--pt tended to push back against him and not as effective)             PT Education - 02/11/19 0943    Education Details  see HEP (educated on importance of knee extended as stretching ankle, to improve ankle ROM in standing/walking and potentially decr posterior lean/tendency); husband received education/handout from MD re: fall prevention and he had reviewed; encouraged husband to continue to pursue assist/respite care for his health     Person(s) Educated  Patient;Spouse    Methods  Demonstration;Explanation;Tactile cues;Verbal cues;Handout    Comprehension  Verbalized understanding;Returned  demonstration;Verbal cues required;Tactile cues required;Need further instruction       PT Short Term Goals - 01/23/19 1304      PT SHORT TERM GOAL #1   Title  =LTGs        PT Long Term Goals - 01/23/19 1304      PT LONG TERM GOAL #1   Title  Family/caregivers will verbalize understanding of fall prevention strategies at home to reduce pts fall risk.  (Target Date: 02/22/19)    Time  4    Period  Weeks    Status  New  Target Date  02/22/19      PT LONG TERM GOAL #2   Title  Pt will perform floor transfer with little UE support in order to indicate safety at home in case of fall and improved LE strength.      Time  4    Period  Weeks    Status  New      PT LONG TERM GOAL #3   Title  Pt will perform 12 steps with single rail at close S level with improved ability to keep weight forward to prevent LOB.      Time  4    Period  Weeks    Status  New      PT LONG TERM GOAL #4   Title  Husband will verbalize completion of plans to hire intermittent caregivers to provide husband respite care.     Time  4    Period  Weeks    Status  New            Plan - 02/11/19 0947    Clinical Impression Statement  Session focused on problem-solving safest technique for up/down steps (as pt has fallen backwards on steps x 2, both ascending), up from floor, car transfer, and increasing bil ankle ROM (tight heel cords). Increased time for all activities due to aphasia and decr processing ?apraxia. Husband reports he plans to have pt's son (his step-son) bring pt to therapy for education as well. Husband reports he may attend as well and encouraged that he could take that time for himself if he feels safe sending pt with her son. Either approach would be fine.     Rehab Potential  Fair    Clinical Impairments Affecting Rehab Potential  significance of language/cognitive deficits     PT Frequency  2x / week    PT Duration  4 weeks    PT Treatment/Interventions  ADLs/Self Care Home  Management;DME Instruction;Functional mobility training;Therapeutic activities;Therapeutic exercise;Balance training;Patient/family education;Cognitive remediation    PT Next Visit Plan  how is getting into car going? how is ankle DF stretch? if son here, review safe guarding with pt for ambulation (?stairs) try some some of posterior stepping strategy??; gastroc stretch (?standing on ramp); maybe try floor transfers again (very difficult when [employer]king tried)    Consulted and Agree with Plan of Care  Patient;Family member/caregiver    Family Member Consulted  Husband [name]        Patient will benefit from skilled therapeutic intervention in order to improve the following deficits and impairments:  Decreased cognition, Decreased knowledge of precautions, Decreased knowledge of use of DME, Decreased safety awareness, Decreased strength, Postural dysfunction, Improper body mechanics  Visit Diagnosis: Unsteadiness on feet  Repeated falls  Muscle weakness (generalized)     Problem List Patient Active Problem List   Diagnosis Date Noted  . Gait abnormality 02/06/2019  . Endometrial ca ([place]) 01/19/2016  . 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 Primary progressive aphasia ([place]) 08/09/2015  . Anxiety 05/08/2014  . Acquired aphasia 05/08/2014  . Dysarthria 05/08/2014  . Balbuties 05/08/2014    Rexanne Mano, PT 02/11/2019, 9:57 AM  [facility] [address] [place], [place], [address] Phone: [phone]   Fax:  [phone]  Name: Ann Wright MRN: [id number] Date of Birth: 1956/04/13

## 2019-02-11 NOTE — Patient Instructions (Signed)
Access Code: [facility]  URL: [email or web address]  Date: 02/11/2019  Prepared by: Barry Brunner   Exercises  Seated Gastroc Stretch with Strap - 3 reps - 1 sets - 30 seconds hold - 2x daily - 7x weekly

## 2019-02-14 ENCOUNTER — Ambulatory Visit: Payer: Medicare Other | Admitting: Rehabilitation

## 2019-02-17 ENCOUNTER — Ambulatory Visit: Payer: Medicare Other | Admitting: Physical Therapy

## 2019-02-17 ENCOUNTER — Encounter: Payer: Self-pay | Admitting: Physical Therapy

## 2019-02-17 DIAGNOSIS — R2681 Unsteadiness on feet: Secondary | ICD-10-CM

## 2019-02-17 DIAGNOSIS — R296 Repeated falls: Secondary | ICD-10-CM

## 2019-02-17 DIAGNOSIS — M6281 Muscle weakness (generalized): Secondary | ICD-10-CM

## 2019-02-17 NOTE — Therapy (Addendum)
[facility] [address] [place], [place], [address] Phone: [phone]   Fax:  [phone]  Physical Therapy Treatment  Patient Details  Name: Ann Wright MRN: [id number] Date of Birth: 1956-03-04 Referring Provider (PT): Wenda Low, MD   Encounter Date: 02/17/2019  PT End of Session - 02/17/19 1017    Visit Number  3    Number of Visits  9    Date for PT Re-Evaluation  03/24/19   only plan to see for 30 days max   Authorization Type  Medicare, Federal BCBS-needs progress note every 10th visit.     PT Start Time  1017    PT Stop Time  1102    PT Time Calculation (min)  45 min    Equipment Utilized During Treatment  Gait belt    Activity Tolerance  Patient tolerated treatment well    Behavior During Therapy  Flat affect       Past Medical History:  Diagnosis Date  . Anxiety   . Cancer ([place]) 1987   Breast - no chemotherapy required  . Complication of anesthesia    "I had a hard time of waking up."  . Diverticulitis    x 2 episodes- none recent  . Gait abnormality 02/06/2019  . High cholesterol   . Primary progressive aphasia ([place])    limited speech" does understand verbal commands" per spouse  . Primary progressive aphasia [facility])     Past Surgical History:  Procedure Laterality Date  . BREAST BIOPSY    . BREAST LUMPECTOMY Right 1987   malignant  . COLONOSCOPY    . COLONOSCOPY WITH PROPOFOL N/A 09/12/2016   Procedure: COLONOSCOPY WITH PROPOFOL;  Surgeon: Garlan Fair, MD;  [facility];  Service: Endoscopy;  Laterality: N/A;  . DILITATION & CURRETTAGE/HYSTROSCOPY WITH VERSAPOINT RESECTION N/A 01/03/2016   Procedure: DILATATION & CURETTAGE/HYSTEROSCOPY WITH VERSAPOINT RESECTION;  Surgeon: Princess Bruins, MD;  Location: [facility];  Service: Gynecology;  Laterality: N/A;    There were no vitals filed for this visit.  Subjective Assessment - 02/17/19 1017    Subjective  No changes. No  falls. Son reports he has not seen pt "freeze" except on the steps.     Patient is accompained by:  Family member   [name], husband   Pertinent History  Primary Progressive Aphasia (April 2014-is mostly non verbal at this time, but does have some receptive ability-per husband).     Limitations  Walking    Patient Stated Goals  "to continue to walk as long as possible." Per husband    Currently in Pain?  No/denies                       [facility] Adult PT Treatment/Exercise - 02/17/19 0001      Transfers   Transfers  Floor to Transfer    Floor to Transfer  3: Mod assist;Multiple attempts;With upper extremity assist    Floor to Transfer Details (indicate cue type and reason)  x 1 rep, however incr time. Pt easily lowers herself down to floor (with UE support on mat table). Struggles with moving from long-sitting to tall-kneeling to 1/2 kneeling and using UE support on mat table or chair to come up to stand. Ultimately required mod assist from side-sitting to quadruped and again tall-kneeling to 1/2 kneeling.     Comments  son reports pt has been sitting first on car seat and then putting feet into car (  as instructed last visit)      Ambulation/Gait   Ambulation/Gait Assistance  4: Min guard    Ambulation/Gait Assistance Details  no freezing of gait noted today. Educated son on trying rocking left<>right and then step forward to work out of a freeze. He reports freezing most often happens on stairs    Ambulation Distance (Feet)  100 Feet   200,    Assistive device  None    Gait Pattern  Step-through pattern;Wide base of support    Ambulation Surface  Level;Indoor    Stairs  Yes    Stairs Assistance  4: Min guard;3: Mod assist    Stairs Assistance Details (indicate cue type and reason)  ascended minguard x 2 (with rail); descended first time minguard, second time mod assist with posterior LOB and recovered due to gait belt and centering pt over her BOS    Stair Management Technique   One rail Right;Alternating pattern;Forwards;Other (comment)   see above   Number of Stairs  4   x2   Height of Stairs  6    Gait Comments  educated son on use of waist band to hold when pt going up/down curbs       Exercises   Exercises  Other Exercises    Other Exercises   bil ankle DF stretches with knee extended; with mod assist, bil hands on counter and walk both feet backwards to stretch bil heel cords; ultimately did best seated with back support, leg extended over stool with strap around forefoot and pulling into DF          Balance Exercises - 02/17/19 1959      Balance Exercises: Standing   Stepping Strategy  Posterior;UE support   attempted at kitchen sink and pt could not process/complete       PT Education - 02/17/19 2000    Education Details  educating son on safe guarding/assisting patient (especially on stairs or outdoors unlevel surfaces)       PT Short Term Goals - 01/23/19 1304      PT SHORT TERM GOAL #1   Title  =LTGs        PT Long Term Goals - 01/23/19 1304      PT LONG TERM GOAL #1   Title  Family/caregivers will verbalize understanding of fall prevention strategies at home to reduce pts fall risk.  (Target Date: 02/22/19)    Time  4    Period  Weeks    Status  New    Target Date  02/22/19      PT LONG TERM GOAL #2   Title  Pt will perform floor transfer with little UE support in order to indicate safety at home in case of fall and improved LE strength.      Time  4    Period  Weeks    Status  New      PT LONG TERM GOAL #3   Title  Pt will perform 12 steps with single rail at close S level with improved ability to keep weight forward to prevent LOB.      Time  4    Period  Weeks    Status  New      PT LONG TERM GOAL #4   Title  Husband will verbalize completion of plans to hire intermittent caregivers to provide husband respite care.     Time  4    Period  Weeks    Status  New  Plan - 02/17/19 2003    Clinical  Impression Statement  Session focused on balance training, functional mobility training, stair training, and educating son on proper techniques for assisting patient in all these areas. Son very attentive throughout session. Patient may be limited in making much more progress due to difficulty understanding how to do the exercises. Will attempt to elicit ankle stategies with foam beam or rockerboard next visit.     Rehab Potential  Fair    Clinical Impairments Affecting Rehab Potential  significance of language/cognitive deficits     PT Frequency  2x / week    PT Duration  4 weeks    PT Treatment/Interventions  ADLs/Self Care Home Management;DME Instruction;Functional mobility training;Therapeutic activities;Therapeutic exercise;Balance training;Patient/family education;Cognitive remediation    PT Next Visit Plan  ? try Nustep/scifit for warm-up and potential aerobic exercise post-discharge; try // bars and standing on foam beam to elicit ankle strategy, ?try rocker board, ? try corner exercises to add to HEP? begin to prepare for discharge    Consulted and Agree with Plan of Care  Patient;Family member/caregiver    Family Member Consulted  Husband [name]        Patient will benefit from skilled therapeutic intervention in order to improve the following deficits and impairments:  Decreased cognition, Decreased knowledge of precautions, Decreased knowledge of use of DME, Decreased safety awareness, Decreased strength, Postural dysfunction, Improper body mechanics  Visit Diagnosis: Unsteadiness on feet  Repeated falls  Muscle weakness (generalized)     Problem List Patient Active Problem List   Diagnosis Date Noted  . Gait abnormality 02/06/2019  . Endometrial ca ([place]) 01/19/2016  . 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 Primary progressive aphasia ([place]) 08/09/2015  . Anxiety 05/08/2014  . Acquired aphasia 05/08/2014  . Dysarthria 05/08/2014  . Balbuties 05/08/2014    Rexanne Mano,  PT 02/17/2019, 8:10 PM  [facility] [address] [place], [place], [address] Phone: [phone]   Fax:  [phone]  Name: Ann Wright MRN: [id number] Date of Birth: 1956-09-24

## 2019-02-17 NOTE — Patient Instructions (Signed)
Access Code: [facility]  URL: [email or web address]  Date: 02/17/2019  Prepared by: Barry Brunner   Exercises  Seated Gastroc Stretch with Strap - 3 reps - 1 sets - 30 seconds hold - 2x daily - 7x weekly  Wall Push Up - 1 reps - 1 sets - 30-90 seconds hold - 2x daily - 7x weekly

## 2019-02-19 ENCOUNTER — Encounter: Payer: Self-pay | Admitting: Physical Therapy

## 2019-02-19 ENCOUNTER — Ambulatory Visit: Payer: Medicare Other | Admitting: Physical Therapy

## 2019-02-19 VITALS — BP 127/76 | HR 90

## 2019-02-19 DIAGNOSIS — R2681 Unsteadiness on feet: Secondary | ICD-10-CM

## 2019-02-19 DIAGNOSIS — M6281 Muscle weakness (generalized): Secondary | ICD-10-CM

## 2019-02-19 NOTE — Therapy (Signed)
[facility] [address] [place], [place], [address] Phone: [phone]   Fax:  [phone]  Physical Therapy Treatment  Patient Details  Name: Ann Wright MRN: [id number] Date of Birth: Jul 15, 1956 Referring Provider (PT): Wenda Low, MD   Encounter Date: 02/19/2019  PT End of Session - 02/19/19 1023    Visit Number  4    Number of Visits  9    Date for PT Re-Evaluation  03/24/19   only plan to see for 30 days max   Authorization Type  Medicare, Federal BCBS-needs progress note every 10th visit.     PT Start Time  1021    PT Stop Time  1103    PT Time Calculation (min)  42 min    Equipment Utilized During Treatment  Gait belt    Activity Tolerance  Patient tolerated treatment well    Behavior During Therapy  Flat affect       Past Medical History:  Diagnosis Date  . Anxiety   . Cancer ([place]) 1987   Breast - no chemotherapy required  . Complication of anesthesia    "I had a hard time of waking up."  . Diverticulitis    x 2 episodes- none recent  . Gait abnormality 02/06/2019  . High cholesterol   . 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 Primary progressive aphasia [facility])     Past Surgical History:  Procedure Laterality Date  . BREAST BIOPSY    . BREAST LUMPECTOMY Right 1987   malignant  . COLONOSCOPY    . COLONOSCOPY WITH PROPOFOL N/A 09/12/2016   Procedure: COLONOSCOPY WITH PROPOFOL;  Surgeon: Garlan Fair, MD;  Location: [facility];  Service: Endoscopy;  Laterality: N/A;  . DILITATION & CURRETTAGE/HYSTROSCOPY WITH VERSAPOINT RESECTION N/A 01/03/2016   Procedure: DILATATION & CURETTAGE/HYSTEROSCOPY WITH VERSAPOINT RESECTION;  Surgeon: Princess Bruins, MD;  Location: [facility];  Service: Gynecology;  Laterality: N/A;    Vitals:   02/19/19 1028 02/19/19 1034  BP: (!) 141/98  After Nu-step x 7 min 127/76 after seated rest  Pulse: 100  90  SpO2: 98%     Subjective Assessment - 02/19/19 1438    Subjective  No changes. No falls. Son reports he saw pt freeze when leaving clinic last time--she was walking across where the concrete changes to asphalt and froze an could not start walking until son assisted her. (there was no step or change in elevation, just the change in color of the surface)    Patient is accompained by:  Family member   Lavell Luster, husband   Pertinent History  Primary Progressive Aphasia (April 2014-is mostly non verbal at this time, but does have some receptive ability-per husband).     Limitations  Walking    Patient Stated Goals  "to continue to walk as long as possible." Per husband    Currently in Pain?  No/denies                       [facility] Adult PT Treatment/Exercise - 02/19/19 1446      Transfers   Sit to Stand  7: Independent;Without upper extremity assist;From chair/3-in-1   14" stool with 4 inch cushion to simulate sofa   Stand to Sit  4: Min guard;7: Independent;Without upper extremity assist   to simulated sofa   Comments X 10 reps;  facilitation and demonstration for controlled descent to low  surface; no imbalance when standing from low surface      Ambulation/Gait   Ambulation/Gait Assistance  5: Supervision;7: Independent    Ambulation/Gait Assistance Details  no freezing noted    Ambulation Distance (Feet)  500 Feet    Assistive device  None    Gait Pattern  Step-through pattern;Wide base of support    Ambulation Surface  Level;Unlevel;Indoor;Outdoor;Paved;Gravel;Grass    Curb  5: Supervision    Curb Details (indicate cue type and reason)  pt slows and at times stops prior to stepping down or up; no physical assist needed    Gait Comments  no imbalance noted with changes in surfaces;       Posture/Postural Control   Posture/Postural Control  Postural limitations    Postural Limitations  Rounded Shoulders;Forward head    Posture Comments  attempted standing at doorframe  with pt trying to get head aligned over torso and press head into a towel behind her head with pt unable to understand (and resisting facilitation);           Balance Exercises - 02/19/19 1510      Balance Exercises: Standing   Tandem Stance  --   attempted; pt could not process/understand   Rockerboard  Anterior/posterior;EO;Intermittent UE support    Balance Beam  blue beam; walking tandem with UE support on // bars; turned crosswise and standing feet hip width intermittent UE support, progressing to marching and "taking a bow" for hip strategy; crosswise with step off/on anteriorly and posteriorly with single UE support    Retro Gait  3 reps          PT Short Term Goals - 01/23/19 1304      PT SHORT TERM GOAL #1   Title  =LTGs        PT Long Term Goals - 01/23/19 1304      PT LONG TERM GOAL #1   Title  Family/caregivers will verbalize understanding of fall prevention strategies at home to reduce pts fall risk.  (Target Date: 02/22/19)    Time  4    Period  Weeks    Status  New    Target Date  02/22/19      PT LONG TERM GOAL #2   Title  Pt will perform floor transfer with little UE support in order to indicate safety at home in case of fall and improved LE strength.      Time  4    Period  Weeks    Status  New      PT LONG TERM GOAL #3   Title  Pt will perform 12 steps with single rail at close S level with improved ability to keep weight forward to prevent LOB.      Time  4    Period  Weeks    Status  New      PT LONG TERM GOAL #4   Title  Husband will verbalize completion of plans to hire intermittent caregivers to provide husband respite care.     Time  4    Period  Weeks    Status  New            Plan - 02/19/19 1513    Clinical Impression Statement  Son again present with patient. Reported no questions or issues with HEP. Session focused on gait on outdoor unlevel surfaces (including up/down curb), education on benefits of aerobic exercise and  brain health (pt has a stationary bike with moving UEs  at home, but does not usually use per son), and postural training. Pateint did well with all activities except for postural training and is demonstrating increase forward head with rounded shoulders than when previous seen. Discussed plan to again attempt to focus on posture training/exercises next visit, however pt is doing well with her balance and unsure if there are futther areas for PT to address beyond next visit. Will discuss with pt's evaluating PT (whom pt sees for next visit).     Rehab Potential  Fair    Clinical Impairments Affecting Rehab Potential  significance of language/cognitive deficits     PT Frequency  2x / week    PT Duration  4 weeks    PT Treatment/Interventions  ADLs/Self Care Home Management;DME Instruction;Functional mobility training;Therapeutic activities;Therapeutic exercise;Balance training;Patient/family education;Cognitive remediation    PT Next Visit Plan  give/discuss fall prevention informaiton; ? Nustep/scifit for warm-up and discuss potential aerobic exercise post-discharge by using her home stationary bike (?simulate getting on/off bike for safety); try to again work on posture (?give supine stretch with towel roll and arms out to sides?; begin to assess LTGs and prepare for discharge (son in agreement will not need furll # of visits predicted)    Consulted and Agree with Plan of Care  Patient;Family member/caregiver    Family Member Consulted  son        Patient will benefit from skilled therapeutic intervention in order to improve the following deficits and impairments:  Decreased cognition, Decreased knowledge of precautions, Decreased knowledge of use of DME, Decreased safety awareness, Decreased strength, Postural dysfunction, Improper body mechanics  Visit Diagnosis: Unsteadiness on feet  Muscle weakness (generalized)     Problem List Patient Active Problem List   Diagnosis Date Noted  . Gait  abnormality 02/06/2019  . Endometrial ca ([place]) 01/19/2016  . 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 Primary progressive aphasia ([facility]) 08/09/2015  . Anxiety 05/08/2014  . Acquired aphasia 05/08/2014  . Dysarthria 05/08/2014  . Balbuties 05/08/2014    Rexanne Mano, PT 02/19/2019, 3:25 PM  [facility] [address] [place], [place], [address] Phone: [phone]   Fax:  [phone]  Name: Ann Wright MRN: [id number] Date of Birth: 11-01-1956

## 2019-02-21 ENCOUNTER — Other Ambulatory Visit: Payer: Self-pay

## 2019-02-21 ENCOUNTER — Emergency Department: Payer: Medicare Other

## 2019-02-21 ENCOUNTER — Emergency Department
Admission: EM | Admit: 2019-02-21 | Discharge: 2019-02-21 | Disposition: A | Discharge status: Home / Self Care | Payer: Medicare Other | Attending: Emergency Medicine | Admitting: Emergency Medicine

## 2019-02-21 DIAGNOSIS — S0101XA Laceration without foreign body of scalp, initial encounter: Secondary | ICD-10-CM | POA: Diagnosis not present

## 2019-02-21 DIAGNOSIS — Y999 Unspecified external cause status: Secondary | ICD-10-CM | POA: Insufficient documentation

## 2019-02-21 DIAGNOSIS — Z7982 Long term (current) use of aspirin: Secondary | ICD-10-CM | POA: Insufficient documentation

## 2019-02-21 DIAGNOSIS — W0110XA Fall on same level from slipping, tripping and stumbling with subsequent striking against unspecified object, initial encounter: Secondary | ICD-10-CM | POA: Diagnosis not present

## 2019-02-21 DIAGNOSIS — Y92009 Unspecified place in unspecified non-institutional (private) residence as the place of occurrence of the external cause: Secondary | ICD-10-CM | POA: Insufficient documentation

## 2019-02-21 DIAGNOSIS — Y9301 Activity, walking, marching and hiking: Secondary | ICD-10-CM | POA: Diagnosis not present

## 2019-02-21 DIAGNOSIS — S0990XA Unspecified injury of head, initial encounter: Secondary | ICD-10-CM | POA: Diagnosis present

## 2019-02-21 DIAGNOSIS — Z853 Personal history of malignant neoplasm of breast: Secondary | ICD-10-CM | POA: Insufficient documentation

## 2019-02-21 DIAGNOSIS — Z79899 Other long term (current) drug therapy: Secondary | ICD-10-CM | POA: Diagnosis not present

## 2019-02-21 DIAGNOSIS — Z7984 Long term (current) use of oral hypoglycemic drugs: Secondary | ICD-10-CM | POA: Diagnosis not present

## 2019-02-21 DIAGNOSIS — W19XXXA Unspecified fall, initial encounter: Secondary | ICD-10-CM

## 2019-02-21 MED ORDER — TETANUS-DIPHTH-ACELL PERTUSSIS 5-2.5-18.5 LF-MCG/0.5 IM SUSP
0.5000 mL | Freq: Once | INTRAMUSCULAR | Status: DC
  Filled 2019-02-21: qty 0.5

## 2019-02-21 MED ORDER — LIDOCAINE HCL (PF) 1 % IJ SOLN
5.0000 mL | Freq: Once | INTRAMUSCULAR | Status: AC
  Administered 2019-02-21: 5 mL via INTRADERMAL
  Filled 2019-02-21: qty 5

## 2019-02-21 NOTE — ED Provider Notes (Signed)
[facility] Emergency Department Provider Note  ____________________________________________  Time seen: Approximately 6:25 PM  I have reviewed the triage vital signs and the nursing notes.   HISTORY  Chief Complaint Fall and Head Injury    Level 5 Caveat: Portions of the History and Physical including HPI and review of systems are unable to be completely obtained due to patient being a poor historian   HPI Ann Wright is a 63 y.o. female with a history of diverticulitis, breast cancer, and chronic aphasia who is brought to the ED after a mechanical fall at home in which she lost her balance and hit the back of her head on the stove.  No loss of consciousness.  According the family the patient is at her baseline mental status, following commands and interacting normally.  To them she is denied any pain or other symptoms.      Past Medical History:  Diagnosis Date  . Anxiety   . Cancer ([place]) 1987   Breast - no chemotherapy required  . Complication of anesthesia    "I had a hard time of waking up."  . Diverticulitis    x 2 episodes- none recent  . Gait abnormality 02/06/2019  . High cholesterol   . 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 Primary progressive aphasia [facility])      Patient Active Problem List   Diagnosis Date Noted  . Gait abnormality 02/06/2019  . Endometrial ca ([place]) 01/19/2016  . 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 Anxiety 05/08/2014  . Acquired aphasia 05/08/2014  . Dysarthria 05/08/2014  . Balbuties 05/08/2014     Past Surgical History:  Procedure Laterality Date  . BREAST BIOPSY    . BREAST LUMPECTOMY Right 1987   malignant  . COLONOSCOPY    . COLONOSCOPY WITH PROPOFOL N/A 09/12/2016   Procedure: COLONOSCOPY WITH PROPOFOL;  Surgeon: Garlan Fair, MD;  Location: [facility];  Service: Endoscopy;  Laterality: N/A;   . DILITATION & CURRETTAGE/HYSTROSCOPY WITH VERSAPOINT RESECTION N/A 01/03/2016   Procedure: DILATATION & CURETTAGE/HYSTEROSCOPY WITH VERSAPOINT RESECTION;  Surgeon: Princess Bruins, MD;  Location: [facility];  Service: Gynecology;  Laterality: N/A;     Prior to Admission medications   Medication Sig Start Date End Date Taking? Authorizing Provider  Acetaminophen (TYLENOL EX ST ARTHRITIS PAIN PO) Take 1 Dose by mouth as needed.    [provider]  aspirin EC 81 MG tablet Take 81 mg by mouth daily.    [provider]  atorvastatin (LIPITOR) 20 MG tablet Take 20 mg by mouth daily.    [provider]  cholecalciferol (VITAMIN D) 1000 units tablet Take 1,000 Units by mouth daily.    [provider]  escitalopram (LEXAPRO) 10 MG tablet TAKE 1 TABLET BY MOUTH EVERY DAY 09/30/18   Kathrynn Ducking, MD  HYDROcodone-acetaminophen (NORCO/VICODIN) 5-325 MG tablet Take 1 tablet by mouth every 6 (six) hours as needed. 12/31/18   Gregor Hams, MD  Loratadine (CLARITIN PO) Take 1 tablet by mouth daily.    [provider]  meloxicam (MOBIC) 15 MG tablet TAKE 1 TABLET BY MOUTH EVERY DAY 01/09/19   Kathrynn Ducking, MD  metFORMIN (GLUCOPHAGE) 500 MG tablet Take by mouth daily with breakfast.    [provider]  Multiple Vitamin (MULTIVITAMIN WITH MINERALS) TABS tablet Take 1 tablet by mouth daily.    [provider]  vitamin B-12 (CYANOCOBALAMIN) 1000 MCG tablet Take 1,000 mcg by mouth daily.    [provider]     Allergies Patient has no known allergies.   Family History  Problem Relation Age of Onset  . Heart attack Mother   . Pneumonia Father   . Cancer Sister        breast  . Cancer Sister        breast  . Cancer Daughter        breast    Social History Social History   Tobacco Use  . Smoking status: Never Smoker  . Smokeless tobacco: Never Used  Substance Use Topics  . Alcohol use: No    Comment: occasional   .  Drug use: No    Review of Systems Level 5 Caveat: Portions of the History and Physical including HPI and review of systems are unable to be completely obtained due to patient being a poor historian   Constitutional:   No known fever.  ENT:   No rhinorrhea. Cardiovascular:   No chest pain or syncope. Respiratory:   No dyspnea or cough. Gastrointestinal:   Negative for abdominal pain, vomiting and diarrhea.  Musculoskeletal:   Negative for focal pain or swelling ____________________________________________   PHYSICAL EXAM:  VITAL SIGNS: ED Triage Vitals  Enc Vitals Group     BP 02/21/19 1326 (!) 159/93     Pulse Rate 02/21/19 1326 82     Resp 02/21/19 1326 17     Temp 02/21/19 1326 98.1 F (36.7 C)     Temp Source 02/21/19 1326 Oral     SpO2 02/21/19 1326 97 %     Weight 02/21/19 1327 182 lb (82.6 kg)     Height 02/21/19 1327 _0  (1.676 m)     Head Circumference --      Peak Flow --      Pain Score --      Pain Loc --      Pain Edu? --      Excl. in [place]? --     Vital signs reviewed, nursing assessments reviewed.   Constitutional:   Alert. Non-toxic appearance. Eyes:   Conjunctivae are normal. EOMI. PERRL. ENT      Head:   Normocephalic with 3 cm linear laceration over the occipital scalp.  Hemostatic      Nose:   No congestion/rhinnorhea.  No epistaxis      Mouth/Throat:   MMM, no pharyngeal erythema. No peritonsillar mass.  No intraoral injury      Neck:   No meningismus. Full ROM.  No midline spinal tenderness Hematological/Lymphatic/Immunilogical:   No cervical lymphadenopathy. Cardiovascular:   RRR. Symmetric bilateral radial and DP pulses.  No murmurs. Cap refill less than 2 seconds. Respiratory:   Normal respiratory effort without tachypnea/retractions. Breath sounds are clear and equal bilaterally. No wheezes/rales/rhonchi. Gastrointestinal:   Soft and nontender. Non distended. There is no CVA tenderness.  No rebound, rigidity, or guarding. Musculoskeletal:    Normal range of motion in all extremities. No joint effusions.  No lower extremity tenderness.  No edema.  Ambulatory Neurologic:   Nonverbal, chronic.  Understands language and follows commands.  Motor grossly intact. No acute focal neurologic deficits are appreciated.  Skin:    Skin is warm, dry with scalp laceration as above  ____________________________________________    LABS (pertinent positives/negatives) (all labs ordered are listed, but only abnormal results are displayed) Labs Reviewed - No data to display ____________________________________________   EKG  ____________________________________________    RADIOLOGY  Ct Head Wo Contrast  Result Date: 02/21/2019 CLINICAL DATA:  63 y/o  F; fall with injury to back of head. EXAM: CT HEAD WITHOUT CONTRAST CT CERVICAL SPINE WITHOUT CONTRAST TECHNIQUE: Multidetector CT imaging of the head and cervical spine was performed following the standard protocol without intravenous contrast. Multiplanar CT image reconstructions of the cervical spine were also generated. COMPARISON:  12/31/2018 CT head and cervical spine. FINDINGS: CT HEAD FINDINGS Brain: No evidence of acute infarction, hemorrhage, hydrocephalus, extra-axial collection or mass lesion/mass effect. Stable chronic microvascular ischemic changes and volume loss of the brain. Vascular: Calcific atherosclerosis of carotid siphons. No hyperdense vessel identified. Skull: Normal. Negative for fracture or focal lesion. Sinuses/Orbits: No acute finding. Other: None. CT CERVICAL SPINE FINDINGS Alignment: Straightening of cervical lordosis. C5-6 stable grade 1 retrolisthesis. Skull base and vertebrae: No acute fracture. No primary bone lesion or focal pathologic process. Soft tissues and spinal canal: No prevertebral fluid or swelling. No visible canal hematoma. Disc levels: Mild spondylosis of the cervical spine greatest at the C5-6 level or uncovertebral and facet hypertrophy encroach on  the neural foramen. No high-grade bony spinal canal stenosis. Upper chest: Negative. Other: Negative. IMPRESSION: 1. No acute intracranial abnormality or calvarial fracture. 2. Stable chronic microvascular ischemic changes and parenchymal volume loss of the brain. 3. No acute fracture or dislocation of the cervical spine. 4. Stable mild cervical spondylosis greatest at the C5-6 level. Electronically Signed   By: Kristine Garbe M.D.   On: 02/21/2019 14:13   Ct Cervical Spine Wo Contrast  Result Date: 02/21/2019 CLINICAL DATA:  63 y/o  F; fall with injury to back of head. EXAM: CT HEAD WITHOUT CONTRAST CT CERVICAL SPINE WITHOUT CONTRAST TECHNIQUE: Multidetector CT imaging of the head and cervical spine was performed following the standard protocol without intravenous contrast. Multiplanar CT image reconstructions of the cervical spine were also generated. COMPARISON:  12/31/2018 CT head and cervical spine. FINDINGS: CT HEAD FINDINGS Brain: No evidence of acute infarction, hemorrhage, hydrocephalus, extra-axial collection or mass lesion/mass effect. Stable chronic microvascular ischemic changes and volume loss of the brain. Vascular: Calcific atherosclerosis of carotid siphons. No hyperdense vessel identified. Skull: Normal. Negative for fracture or focal lesion. Sinuses/Orbits: No acute finding. Other: None. CT CERVICAL SPINE FINDINGS Alignment: Straightening of cervical lordosis. C5-6 stable grade 1 retrolisthesis. Skull base and vertebrae: No acute fracture. No primary bone lesion or focal pathologic process. Soft tissues and spinal canal: No prevertebral fluid or swelling. No visible canal hematoma. Disc levels: Mild spondylosis of the cervical spine greatest at the C5-6 level or uncovertebral and facet hypertrophy encroach on the neural foramen. No high-grade bony spinal canal stenosis. Upper chest: Negative. Other: Negative. IMPRESSION: 1. No acute intracranial abnormality or calvarial fracture. 2.  Stable chronic microvascular ischemic changes and parenchymal volume loss of the brain. 3. No acute fracture or dislocation of the cervical spine. 4. Stable mild cervical spondylosis greatest at the C5-6 level. Electronically Signed   By: Kristine Garbe M.D.   On: 02/21/2019 14:13    ____________________________________________   PROCEDURES .Marland KitchenLaceration Repair Date/Time: 02/21/2019 6:27 PM Performed by: Carrie Mew, MD Authorized by: Carrie Mew, MD   Consent:    Consent obtained:  Verbal   Consent given by:  Patient   Risks discussed:  Infection, pain, retained foreign body, poor cosmetic result and poor wound healing   Alternatives discussed:  No treatment Anesthesia (see MAR for exact dosages):    Anesthesia method:  Local  infiltration   Local anesthetic:  Lidocaine 1% w/o epi Laceration details:    Location:  Scalp   Scalp location:  Occipital   Length (cm):  3 Repair type:    Repair type:  Simple Pre-procedure details:    Preparation:  Patient was prepped and draped in usual sterile fashion and imaging obtained to evaluate for foreign bodies Exploration:    Hemostasis achieved with:  Direct pressure   Wound exploration: entire depth of wound probed and visualized     Wound extent: no foreign bodies/material noted, no muscle damage noted, no underlying fracture noted and no vascular damage noted     Contaminated: no   Treatment:    Area cleansed with:  Saline and Betadine   Amount of cleaning:  Extensive   Irrigation solution:  Sterile saline   Visualized foreign bodies/material removed: no   Skin repair:    Repair method:  Sutures   Suture size:  4-0   Wound skin closure material used: vicryl rapide.   Suture technique:  Horizontal mattress and simple interrupted   Number of sutures:  3 Approximation:    Approximation:  Close Post-procedure details:    Dressing:  Sterile dressing   Patient tolerance of procedure:  Tolerated well, no immediate  complications    ____________________________________________  DIFFERENTIAL DIAGNOSIS   Intracranial hemorrhage, contusion, concussion  CLINICAL IMPRESSION / ASSESSMENT AND PLAN / ED COURSE  Pertinent labs & imaging results that were available during my care of the patient were reviewed by me and considered in my medical decision making (see chart for details).    Patient is nontoxic, vital signs unremarkable, presents with mechanical fall and scalp laceration.  CT head and neck are unremarkable.  Wound care provided, wound closed with Vicryl sutures, follow-up with primary care.      ____________________________________________   FINAL CLINICAL IMPRESSION(S) / ED DIAGNOSES    Final diagnoses:  Laceration of scalp, initial encounter  Fall in home, initial encounter     ED Discharge Orders    None      Portions of this note were generated with dragon dictation software. Dictation errors may occur despite best attempts at proofreading.   Carrie Mew, MD 02/21/19 [phone]

## 2019-02-21 NOTE — Discharge Instructions (Addendum)
We placed 3 absorbable sutures to close the wound on the back of the scalp.

## 2019-02-21 NOTE — ED Notes (Signed)
Pt arrives with family for LAC on posterior side of head. Pt is no verbal per family and this baseline. Pt is NAD

## 2019-02-21 NOTE — ED Triage Notes (Addendum)
Pt was sent from urgent care after having a loss of balance and falling back and hitting her head, witnessed by by family, denies LOC. Pt has PPA and is unable to communicate, family states this is pt baseline . Pt is alert on arrival and able to follow directions appropriately.  Pt has a lac to posterior head with controlled bleeding, clean bandage present on arrival.

## 2019-02-24 ENCOUNTER — Telehealth: Payer: Self-pay | Admitting: Neurology

## 2019-02-24 ENCOUNTER — Ambulatory Visit: Payer: Medicare Other | Admitting: Rehabilitation

## 2019-02-24 MED ORDER — ESCITALOPRAM OXALATE 20 MG PO TABS: 20.0000 mg | ORAL_TABLET | Freq: Every day | ORAL | 3 refills | Status: DC

## 2019-02-24 NOTE — Telephone Encounter (Signed)
I talk with husband.  The patient is had ongoing problems with anxiety and agitation, we will go up on the Lexapro dose to 20 mg daily.

## 2019-02-26 ENCOUNTER — Ambulatory Visit: Payer: Medicare Other | Admitting: Physical Therapy

## 2019-03-03 ENCOUNTER — Encounter: Payer: Self-pay | Admitting: Rehabilitation

## 2019-03-03 ENCOUNTER — Ambulatory Visit: Payer: Medicare Other | Attending: Internal Medicine | Admitting: Rehabilitation

## 2019-03-03 DIAGNOSIS — R2681 Unsteadiness on feet: Secondary | ICD-10-CM | POA: Diagnosis present

## 2019-03-03 DIAGNOSIS — R296 Repeated falls: Secondary | ICD-10-CM | POA: Diagnosis present

## 2019-03-03 DIAGNOSIS — M6281 Muscle weakness (generalized): Secondary | ICD-10-CM | POA: Diagnosis present

## 2019-03-03 NOTE — Therapy (Signed)
[facility] [address] [place], [place], [address] Phone: [phone]   Fax:  [phone]  Physical Therapy Treatment  Patient Details  Name: Ann Wright MRN: [id number] Date of Birth: 11/28/1956 Referring Provider (PT): Wenda Low, MD   Encounter Date: 03/03/2019  PT End of Session - 03/03/19 1258    Visit Number  5    Number of Visits  9    Date for PT Re-Evaluation  03/24/19   only plan to see for 30 days max   Authorization Type  Medicare, Federal BCBS-needs progress note every 10th visit.     PT Start Time  1015    PT Stop Time  1100    PT Time Calculation (min)  45 min    Equipment Utilized During Treatment  Gait belt    Activity Tolerance  Patient tolerated treatment well    Behavior During Therapy  Flat affect       Past Medical History:  Diagnosis Date  . Anxiety   . Cancer ([place]) 1987   Breast - no chemotherapy required  . Complication of anesthesia    "I had a hard time of waking up."  . Diverticulitis    x 2 episodes- none recent  . Gait abnormality 02/06/2019  . High cholesterol   . 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 Primary progressive aphasia [facility])     Past Surgical History:  Procedure Laterality Date  . BREAST BIOPSY    . BREAST LUMPECTOMY Right 1987   malignant  . COLONOSCOPY    . COLONOSCOPY WITH PROPOFOL N/A 09/12/2016   Procedure: COLONOSCOPY WITH PROPOFOL;  Surgeon: Garlan Fair, MD;  Location: [facility];  Service: Endoscopy;  Laterality: N/A;  . DILITATION & CURRETTAGE/HYSTROSCOPY WITH VERSAPOINT RESECTION N/A 01/03/2016   Procedure: DILATATION & CURETTAGE/HYSTEROSCOPY WITH VERSAPOINT RESECTION;  Surgeon: Princess Bruins, MD;  Location: [facility];  Service: Gynecology;  Laterality: N/A;    There were no vitals filed for this visit.  Subjective Assessment - 03/03/19 1021    Subjective  Pt reports fall  last week in kitchen, family did not see, but she fell backwards and hit head (needed stitches at ED).  Unsure if she was picking up something and fell backwards.     Pertinent History  Primary Progressive Aphasia (April 2014-is mostly non verbal at this time, but does have some receptive ability-per husband).     Limitations  Walking    Patient Stated Goals  "to continue to walk as long as possible." Per husband    Currently in Pain?  No/denies                       [facility] Adult PT Treatment/Exercise - 03/03/19 1020      Ambulation/Gait   Ambulation/Gait  Yes    Ambulation/Gait Assistance  5: Supervision;4: Min assist    Ambulation/Gait Assistance Details  S to guide throughout session.  On level surfaces, pt did not have any freezing moments.  Set up obstacle course where pt needed to step over black barriers, red therapy mat and 4" step.  Pt needs guiding assist throughout and did note when she stepped up to 4" step she did have large posterior LOB requiring mod A to correct.  Discussed keeping light hand on pt with ambulation, esp in community to cue pt to continue moving forward as this seemed to work on  remaining sets (performed 6 sets).      Ambulation Distance (Feet)  150 Feet    Assistive device  None    Gait Pattern  Step-through pattern;Wide base of support    Ambulation Surface  Level;Indoor    Stairs  Yes    Stairs Assistance  4: Min assist    Stairs Assistance Details (indicate cue type and reason)  Performed stairs x 6 reps during session.  She did have freezing once on bottom step, but when guided forward gently.  Note that on subsequent reps, she did tend to keep hands to close to body and even somewhat posterior, therefore PT guided hands, esp with descent to allow pt to keep moving forward.  Discussed this with son and maybe assisting pt from the side and keeping hand on her back, as she even tends to fall posterior going down stairs (son reports she did this in  movie theater last week and sat on step).  Pts son verbalized understanding.     Stair Management Technique  Two rails;Alternating pattern;Forwards    Number of Stairs  4   x 6 reps    Height of Stairs  6      Self-Care   Self-Care  Other Self-Care Comments    Other Self-Care Comments   Provided pts son with print out of senior service resources (social work) during session.  Feel that pt will need 24/7 S, and there seems to be some caregiver burnout from husband.  Discussed how repsite care could also assist.  Son to get in touch with social worker number provided.  Also discussed use of baby gates at home in order to ensure pt has continuous S as these falls tend to happen once she is in another room.  Discussed possibly having multiple throughout house in order to move easily from room to room.  Son verbalized understanding.       Therapeutic Activites    Therapeutic Activities  Lifting    Lifting  Had pt pick up many items during session from jacks to cards to coins (smaller to allow more time in squat) to attempt to provoke posterior LOB.  Pt did not have posterior LOB, however when item was very small and she couldn't pick up easily, note tendency to move closer to her, causing posterior weight shift.        Neuro Re-ed    Neuro Re-ed Details   Attempted to work on repeated posterior stepping to hopefully carryover to posterior stepping strategy.  Performed 20 reps of retro stepping alternating LEs from foam beam with min A.               PT Education - 03/03/19 1258    Education Details  see self care     Person(s) Educated  Patient;Child(ren)    Methods  Explanation;Demonstration    Comprehension  Verbalized understanding       PT Short Term Goals - 01/23/19 1304      PT SHORT TERM GOAL #1   Title  =LTGs        PT Long Term Goals - 01/23/19 1304      PT LONG TERM GOAL #1   Title  Family/caregivers will verbalize understanding of fall prevention strategies at home  to reduce pts fall risk.  (Target Date: 02/22/19)    Time  4    Period  Weeks    Status  New    Target Date  02/22/19  PT LONG TERM GOAL #2   Title  Pt will perform floor transfer with little UE support in order to indicate safety at home in case of fall and improved LE strength.      Time  4    Period  Weeks    Status  New      PT LONG TERM GOAL #3   Title  Pt will perform 12 steps with single rail at close S level with improved ability to keep weight forward to prevent LOB.      Time  4    Period  Weeks    Status  New      PT LONG TERM GOAL #4   Title  Husband will verbalize completion of plans to hire intermittent caregivers to provide husband respite care.     Time  4    Period  Weeks    Status  New            Plan - 03/03/19 1258    Clinical Impression Statement  Skilled session focused on attempting to provoke freezing sessions and posterior LOB during session with gait, obstacles and stairs.  Discussed safety at home using baby gates to ensure continuous S as she does tend to pick items up from floor that are very close to her and shifts posteriorly.   Also educated on safety with stairs when descending, standing beside pt keeping hand on back to promote forward movement.  Also provided pt son with [place] work services number to respite/adult day care and in home care.      Rehab Potential  Fair    Clinical Impairments Affecting Rehab Potential  significance of language/cognitive deficits     PT Frequency  2x / week    PT Duration  4 weeks    PT Treatment/Interventions  ADLs/Self Care Home Management;DME Instruction;Functional mobility training;Therapeutic activities;Therapeutic exercise;Balance training;Patient/family education;Cognitive remediation    PT Next Visit Plan  Fall up on any questions regarding social work services given last session-did they call, LTGs and D/C.     Consulted and Agree with Plan of Care  Patient;Family member/caregiver     Family Member Consulted  son        Patient will benefit from skilled therapeutic intervention in order to improve the following deficits and impairments:  Decreased cognition, Decreased knowledge of precautions, Decreased knowledge of use of DME, Decreased safety awareness, Decreased strength, Postural dysfunction, Improper body mechanics  Visit Diagnosis: Unsteadiness on feet  Muscle weakness (generalized)  Repeated falls     Problem List Patient Active Problem List   Diagnosis Date Noted  . Gait abnormality 02/06/2019  . Endometrial ca ([place]) 01/19/2016  . 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 Anxiety 05/08/2014  . Acquired aphasia 05/08/2014  . Dysarthria 05/08/2014  . Balbuties 05/08/2014    Cameron Sprang, PT, MPT [facility] [address] [place], [place], [address] Phone: [phone]   Fax:  [phone] 03/03/19, 1:02 PM  Name: SHYLYNN BRUNING MRN: [id number] Date of Birth: [birth year]

## 2019-03-05 ENCOUNTER — Ambulatory Visit: Payer: Medicare Other | Admitting: Physical Therapy

## 2019-03-05 ENCOUNTER — Encounter: Payer: Self-pay | Admitting: Physical Therapy

## 2019-03-05 DIAGNOSIS — R2681 Unsteadiness on feet: Secondary | ICD-10-CM

## 2019-03-05 DIAGNOSIS — R296 Repeated falls: Secondary | ICD-10-CM

## 2019-03-06 NOTE — Therapy (Signed)
[facility] [address] [place], [place], [address] Phone: [phone]   Fax:  [phone]  Physical Therapy Treatment and Discharge Summary  Patient Details  Name: Ann Wright MRN: [id number] Date of Birth: [birth year] Referring Provider (PT): Wenda Low, MD   Encounter Date: 03/05/2019  PT End of Session - 03/05/19 1017    Visit Number  6    Number of Visits  9    Date for PT Re-Evaluation  03/24/19   only plan to see for 30 days max   Authorization Type  Medicare, Federal BCBS-needs progress note every 10th visit.     PT Start Time  1017    PT Stop Time  1053   d/c visit and full time not needed   PT Time Calculation (min)  36 min    Equipment Utilized During Treatment  Gait belt    Activity Tolerance  Patient tolerated treatment well    Behavior During Therapy  Flat affect       Past Medical History:  Diagnosis Date  . Anxiety   . Cancer ([place]) 1987   Breast - no chemotherapy required  . Complication of anesthesia    "I had a hard time of waking up."  . Diverticulitis    x 2 episodes- none recent  . Gait abnormality 02/06/2019  . High cholesterol   . 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 Primary progressive aphasia [facility])     Past Surgical History:  Procedure Laterality Date  . BREAST BIOPSY    . BREAST LUMPECTOMY Right 1987   malignant  . COLONOSCOPY    . COLONOSCOPY WITH PROPOFOL N/A 09/12/2016   Procedure: COLONOSCOPY WITH PROPOFOL;  Surgeon: Garlan Fair, MD;  [facility];  Service: Endoscopy;  Laterality: N/A;  . DILITATION & CURRETTAGE/HYSTROSCOPY WITH VERSAPOINT RESECTION N/A 01/03/2016   Procedure: DILATATION & CURETTAGE/HYSTEROSCOPY WITH VERSAPOINT RESECTION;  Surgeon: Princess Bruins, MD;  Location: [facility];  Service: Gynecology;  Laterality: N/A;    There were no vitals filed for this visit.  Subjective  Assessment - 03/05/19 1017    Subjective  No falls reported (but later in session her son states she fell forward onto hands and knees going up the steps). Son asking about how respite works.     Pertinent History  Primary Progressive Aphasia (April 2014-is mostly non verbal at this time, but does have some receptive ability-per husband).     Limitations  Walking    Patient Stated Goals  "to continue to walk as long as possible." Per husband    Currently in Pain?  No/denies                       [facility] Adult PT Treatment/Exercise - 03/05/19 1052      Ambulation/Gait   Ambulation/Gait Assistance  5: Supervision;4: Min guard    Ambulation/Gait Assistance Details  supervision level surfaces; minguard outdoors on unlevel surfaces, grass, curb, slopes, straw    Ambulation Distance (Feet)  500 Feet    Assistive device  1 person hand held assist;None    Gait Pattern  Step-through pattern;Wide base of support    Ambulation Surface  Level;Unlevel;Indoor;Outdoor;Paved;Grass;Other (comment)   straw   Stairs  Yes    Stairs Assistance  4: Min assist    Stairs Assistance Details (indicate cue type and reason)  Son reported she froze going up stairs with  her husband the other night ~3 steps from the top; husband could not get her to move and she ended up on her hands/knees on the steps; son is not sure how he finally got her up the step but wondered if husband tried to help her move through the freeze by pushing her up the step instead of allowing her to stop, reset, even rock laterally and then try stepping up; also practiced on steps keeping hands farther ahead on rails as ascend and descend (pt tends to let hands get behind her body    Stair Management Technique  Two rails;Alternating pattern;Forwards    Number of Stairs  4   x 5 repsj   Height of Stairs  6    Gait Comments  discussed trying to put a picture, sticker,etc as a focal point at the top of the steps (closet door at the top)  for pt to focus her eyes on as ? the visual transition from steps to carpet causes the freezing?0             PT Education - 03/05/19 1700    Education Details  reviewed recommendation for respite care and son did share the information provided with pt's husband; reviewed tips to reduce freezing and technique for stairs    Person(s) Educated  Patient;Child(ren)    Methods  Explanation;Demonstration;Tactile cues;Verbal cues    Comprehension  Verbalized understanding;Returned demonstration       PT Short Term Goals - 01/23/19 1304      PT SHORT TERM GOAL #1   Title  =LTGs        PT Long Term Goals - 03/05/19 1700      PT LONG TERM GOAL #1   Title  Family/caregivers will verbalize understanding of fall prevention strategies at home to reduce pts fall risk.  (Target Date: 02/22/19)    Time  4    Period  Weeks    Status  Achieved      PT LONG TERM GOAL #2   Title  Pt will perform floor transfer with little UE support in order to indicate safety at home in case of fall and improved LE strength.      Baseline  Pt requires up to mod assist due to decr processing    Time  4    Period  Weeks    Status  Not Met      PT LONG TERM GOAL #3   Title  Pt will perform 12 steps with single rail at close S level with improved ability to keep weight forward to prevent LOB.      Time  4    Period  Weeks    Status  Partially Met      PT LONG TERM GOAL #4   Title  Husband will verbalize completion of plans to hire intermittent caregivers to provide husband respite care.     Baseline  03/05/19 Son has given husband resources that were provided to him; pt indicates via "thumbs down" that she does not want to use an aide/caregiver    Time  4    Period  Weeks    Status  Partially Met            Plan - 03/06/19 1722    Clinical Impression Statement  LTGs assessed in preparation for discharge. Pt met one of 4 goals, one goal was not met, and 2 goals were partially met (progress was  made, but not to goal level).  Son was present and his questions were encouraged and answered. Patient is discharged from PT.     Rehab Potential  Fair    Clinical Impairments Affecting Rehab Potential  significance of language/cognitive deficits     PT Frequency  2x / week    PT Duration  4 weeks    PT Treatment/Interventions  ADLs/Self Care Home Management;DME Instruction;Functional mobility training;Therapeutic activities;Therapeutic exercise;Balance training;Patient/family education;Cognitive remediation    Consulted and Agree with Plan of Care  Patient;Family member/caregiver    Family Member Consulted  son        Patient will benefit from skilled therapeutic intervention in order to improve the following deficits and impairments:  Decreased cognition, Decreased knowledge of precautions, Decreased knowledge of use of DME, Decreased safety awareness, Decreased strength, Postural dysfunction, Improper body mechanics  Visit Diagnosis: Unsteadiness on feet  Repeated falls     Problem List Patient Active Problem List   Diagnosis Date Noted  . Gait abnormality 02/06/2019  . Endometrial ca ([place]) 01/19/2016  . 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 Anxiety 05/08/2014  . Acquired aphasia 05/08/2014  . Dysarthria 05/08/2014  . Balbuties 05/08/2014   PHYSICAL THERAPY DISCHARGE SUMMARY  Visits from Start of Care: 6  Current functional level related to goals / functional outcomes: See goals above   Remaining deficits: Posterior bias; decreased balance    Education / Equipment: Husband and son educated on how to assist pt with working through freezing episodes; how to assist on the stairs (frequent cause of falls)  Plan: Patient agrees to discharge.  Patient goals were partially met. Patient is being discharged due to lack of progress.  ?????        Rexanne Mano, PT 03/06/2019, 5:25 PM  [facility] [address] [place], [place], [address] Phone: [phone]   Fax:  [phone]  Name: Ann Wright MRN: [id number] Date of Birth: 1956-03-27

## 2019-03-22 ENCOUNTER — Other Ambulatory Visit: Payer: Self-pay | Admitting: Neurology

## 2019-04-09 ENCOUNTER — Telehealth: Payer: Self-pay | Admitting: Neurology

## 2019-04-09 NOTE — Telephone Encounter (Signed)
I contacted the pt's husband and requested a call back to schedule VV.  If husband calls back please offer a vv (VIDEO) first and if he does not agree offer a telephone visit.   We can use EMG slots for f/u appts.

## 2019-04-09 NOTE — Telephone Encounter (Signed)
Noted, pre charting will be completed closer to visit.

## 2019-04-09 NOTE — Telephone Encounter (Signed)
Patient's husband Legrand [place], on [place], calling requesting a telephone conference call with Dr Jannifer Franklin. Patient's condition is getting worse. Please call to arrange and advise [phone]

## 2019-04-09 NOTE — Telephone Encounter (Signed)
husband states pt's condition has progressed faster than normal 04-09-2019 pt husband has called and gave verbal okay to file insurance for a telephone visit with Dr Bishop Dublin & pt declined VV) on 04-14-2019/LB

## 2019-04-12 ENCOUNTER — Other Ambulatory Visit: Payer: Self-pay

## 2019-04-12 ENCOUNTER — Encounter (HOSPITAL_COMMUNITY): Payer: Self-pay

## 2019-04-12 ENCOUNTER — Emergency Department (HOSPITAL_COMMUNITY): Payer: Medicare Other

## 2019-04-12 ENCOUNTER — Emergency Department (HOSPITAL_COMMUNITY)
Admission: EM | Admit: 2019-04-12 | Discharge: 2019-04-12 | Disposition: A | Discharge status: Home / Self Care | Payer: Medicare Other | Attending: Emergency Medicine | Admitting: Emergency Medicine

## 2019-04-12 DIAGNOSIS — Z79899 Other long term (current) drug therapy: Secondary | ICD-10-CM | POA: Insufficient documentation

## 2019-04-12 DIAGNOSIS — Z23 Encounter for immunization: Secondary | ICD-10-CM | POA: Insufficient documentation

## 2019-04-12 DIAGNOSIS — W19XXXA Unspecified fall, initial encounter: Secondary | ICD-10-CM

## 2019-04-12 DIAGNOSIS — Y929 Unspecified place or not applicable: Secondary | ICD-10-CM | POA: Insufficient documentation

## 2019-04-12 DIAGNOSIS — Y92009 Unspecified place in unspecified non-institutional (private) residence as the place of occurrence of the external cause: Secondary | ICD-10-CM

## 2019-04-12 DIAGNOSIS — W01190A Fall on same level from slipping, tripping and stumbling with subsequent striking against furniture, initial encounter: Secondary | ICD-10-CM | POA: Insufficient documentation

## 2019-04-12 DIAGNOSIS — Z7982 Long term (current) use of aspirin: Secondary | ICD-10-CM | POA: Diagnosis not present

## 2019-04-12 DIAGNOSIS — Y999 Unspecified external cause status: Secondary | ICD-10-CM | POA: Insufficient documentation

## 2019-04-12 DIAGNOSIS — S0101XA Laceration without foreign body of scalp, initial encounter: Secondary | ICD-10-CM | POA: Diagnosis not present

## 2019-04-12 DIAGNOSIS — S0990XA Unspecified injury of head, initial encounter: Secondary | ICD-10-CM | POA: Diagnosis present

## 2019-04-12 DIAGNOSIS — Y939 Activity, unspecified: Secondary | ICD-10-CM | POA: Insufficient documentation

## 2019-04-12 MED ORDER — ACETAMINOPHEN 500 MG PO TABS
1000.0000 mg | ORAL_TABLET | Freq: Once | ORAL | Status: AC
  Administered 2019-04-12: 1000 mg via ORAL
  Filled 2019-04-12: qty 2

## 2019-04-12 MED ORDER — TETANUS-DIPHTH-ACELL PERTUSSIS 5-2.5-18.5 LF-MCG/0.5 IM SUSP
0.5000 mL | Freq: Once | INTRAMUSCULAR | Status: AC
  Administered 2019-04-12: 17:00:00 0.5 mL via INTRAMUSCULAR
  Filled 2019-04-12: qty 0.5

## 2019-04-12 MED ORDER — ACETAMINOPHEN 500 MG PO TABS: 1000.0000 mg | ORAL_TABLET | Freq: Once | ORAL | Status: DC

## 2019-04-12 MED ORDER — LIDOCAINE-EPINEPHRINE 2 %-1:100000 IJ SOLN
20.0000 mL | Freq: Once | INTRAMUSCULAR | Status: AC
  Administered 2019-04-12: 20 mL
  Filled 2019-04-12: qty 20

## 2019-04-12 NOTE — ED Triage Notes (Signed)
To room via EMS.  Pt was walking up step into kitchen, fell back, hit back of head on edge of coffee table.  2" lac to posterior left head.  Bleeding controlled.  C collar on.   Pt has h/o primary progressive aphasia.

## 2019-04-12 NOTE — ED Provider Notes (Signed)
[facility] EMERGENCY DEPARTMENT Provider Note   CSN: [id number] Arrival date & time: 04/12/19  1442    History   Chief Complaint Chief Complaint  Patient presents with  . Fall  . Laceration    HPI Ann Wright is a 63 y.o. female.     Level 5 caveat aphasia.  Patient is a 63 year old female with a chief complaint of a fall.  Per the family she was trying to step up on a landing and lost her balance and fell backwards and struck her head on a coffee table.  No apparent loss of consciousness.  Is been at her baseline per the family.  She sustained a laceration to the left side of her head.  She has had more falls here frequently has had 2 that also required emergency department visits since the beginning of this year.  They had an appointment with her neurologist tomorrow to discuss it.  They deny any decreased oral intake denies vomiting or diarrhea denies cough congestion or fever.  They took a look at her after she fell and did not notice any apparent injury other than the 1 to her head.  The history is provided by the patient.  Fall  This is a recurrent problem. The current episode started 1 to 2 hours ago. The problem occurs constantly. The problem has not changed since onset.Associated symptoms include headaches. Pertinent negatives include no chest pain and no shortness of breath. Nothing aggravates the symptoms. Nothing relieves the symptoms. She has tried nothing for the symptoms. The treatment provided no relief.  Laceration  Associated symptoms: no fever     Past Medical History:  Diagnosis Date  . Anxiety   . Cancer ([place]) 1987   Breast - no chemotherapy required  . Complication of anesthesia    "I had a hard time of waking up."  . Diverticulitis    x 2 episodes- none recent  . Gait abnormality 02/06/2019  . High cholesterol   . 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 Primary  progressive aphasia [facility])     Patient Active Problem List   Diagnosis Date Noted  . Gait abnormality 02/06/2019  . Endometrial ca ([place]) 01/19/2016  . 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 Anxiety 05/08/2014  . Acquired aphasia 05/08/2014  . Dysarthria 05/08/2014  . Balbuties 05/08/2014    Past Surgical History:  Procedure Laterality Date  . BREAST BIOPSY    . BREAST LUMPECTOMY Right 1987   malignant  . COLONOSCOPY    . COLONOSCOPY WITH PROPOFOL N/A 09/12/2016   Procedure: COLONOSCOPY WITH PROPOFOL;  Surgeon: Garlan Fair, MD;  Location: [facility];  Service: Endoscopy;  Laterality: N/A;  . DILITATION & CURRETTAGE/HYSTROSCOPY WITH VERSAPOINT RESECTION N/A 01/03/2016   Procedure: DILATATION & CURETTAGE/HYSTEROSCOPY WITH VERSAPOINT RESECTION;  Surgeon: Princess Bruins, MD;  Location: [facility];  Service: Gynecology;  Laterality: N/A;     OB History   No obstetric history on file.      Home Medications    Prior to Admission medications   Medication Sig Start Date End Date Taking? Authorizing Provider  Acetaminophen (TYLENOL EX ST ARTHRITIS PAIN PO) Take 1 Dose by mouth as needed.    [provider]  aspirin EC 81 MG tablet Take 81 mg by mouth daily.    [provider]  atorvastatin (LIPITOR) 20 MG tablet Take 20 mg by mouth  daily.    [provider]  cholecalciferol (VITAMIN D) 1000 units tablet Take 1,000 Units by mouth daily.    [provider]  escitalopram (LEXAPRO) 20 MG tablet TAKE 1 TABLET BY MOUTH EVERY DAY 03/24/19   Kathrynn Ducking, MD  HYDROcodone-acetaminophen (NORCO/VICODIN) 5-325 MG tablet Take 1 tablet by mouth every 6 (six) hours as needed. 12/31/18   Gregor Hams, MD  Loratadine (CLARITIN PO) Take 1 tablet by mouth daily.    [provider]  meloxicam (MOBIC) 15 MG tablet TAKE 1 TABLET BY MOUTH EVERY DAY 01/09/19   Kathrynn Ducking, MD  metFORMIN (GLUCOPHAGE) 500 MG  tablet Take by mouth daily with breakfast.    [provider]  Multiple Vitamin (MULTIVITAMIN WITH MINERALS) TABS tablet Take 1 tablet by mouth daily.    [provider]  vitamin B-12 (CYANOCOBALAMIN) 1000 MCG tablet Take 1,000 mcg by mouth daily.    [provider]    Family History Family History  Problem Relation Age of Onset  . Heart attack Mother   . Pneumonia Father   . Cancer Sister        breast  . Cancer Sister        breast  . Cancer Daughter        breast    Social History Social History   Tobacco Use  . Smoking status: Never Smoker  . Smokeless tobacco: Never Used  Substance Use Topics  . Alcohol use: No    Comment: occasional   . Drug use: No     Allergies   Patient has no known allergies.   Review of Systems Review of Systems  Constitutional: Negative for chills and fever.  HENT: Negative for congestion and rhinorrhea.   Eyes: Negative for redness and visual disturbance.  Respiratory: Negative for shortness of breath and wheezing.   Cardiovascular: Negative for chest pain and palpitations.  Gastrointestinal: Negative for nausea and vomiting.  Genitourinary: Negative for dysuria and urgency.  Musculoskeletal: Negative for arthralgias and myalgias.  Skin: Negative for pallor and wound.  Neurological: Positive for speech difficulty and headaches. Negative for dizziness.     Physical Exam Updated Vital Signs BP 135/80 (BP Location: Right Arm)   Pulse 94   Temp 98.7 F (37.1 C) (Oral)   Resp 19   SpO2 98%   Physical Exam Vitals signs and nursing note reviewed.  Constitutional:      General: She is not in acute distress.    Appearance: She is well-developed. She is not diaphoretic.  HENT:     Head: Normocephalic.     Comments: Left occiput 6cm lac, gaping Eyes:     Pupils: Pupils are equal, round, and reactive to light.  Neck:     Musculoskeletal: Normal range of motion and neck supple.  Cardiovascular:      Rate and Rhythm: Normal rate and regular rhythm.     Heart sounds: No murmur. No friction rub. No gallop.   Pulmonary:     Effort: Pulmonary effort is normal.     Breath sounds: No wheezing or rales.  Abdominal:     General: There is no distension.     Palpations: Abdomen is soft.     Tenderness: There is no abdominal tenderness.  Musculoskeletal:        General: No tenderness.     Comments: Palpated patient from head to toe without any apparent bony tenderness.  Skin:    General: Skin is warm and  dry.  Neurological:     Mental Status: She is alert.      ED Treatments / Results  Labs (all labs ordered are listed, but only abnormal results are displayed) Labs Reviewed - No data to display  EKG EKG Interpretation  Date/Time:  Saturday April 12 2019 14:49:22 EDT Ventricular Rate:  92 PR Interval:    QRS Duration: 84 QT Interval:  360 QTC Calculation: 446 R Axis:   2 Text Interpretation:  Sinus rhythm Probable left atrial enlargement Low voltage, precordial leads Consider anterior infarct Baseline wander in lead(s) V2 no wpw, prolonged qt or brugada No significant change since last tracing Confirmed by Deno Etienne [phone]) on 04/12/2019 3:04:51 PM   Radiology Ct Head Wo Contrast  Result Date: 04/12/2019 CLINICAL DATA:  Patient fell backwards and hit back of head on coffee table. Posterior scalp laceration. EXAM: CT HEAD WITHOUT CONTRAST CT CERVICAL SPINE WITHOUT CONTRAST TECHNIQUE: Multidetector CT imaging of the head and cervical spine was performed following the standard protocol without intravenous contrast. Multiplanar CT image reconstructions of the cervical spine were also generated. COMPARISON:  02/21/2019 FINDINGS: CT HEAD FINDINGS Brain: There is no evidence for acute hemorrhage, hydrocephalus, mass lesion, or abnormal extra-axial fluid collection. No definite CT evidence for acute infarction. Diffuse loss of parenchymal volume is consistent with atrophy. Patchy low  attenuation in the deep hemispheric and periventricular white matter is nonspecific, but likely reflects chronic microvascular ischemic demyelination. Vascular: No hyperdense vessel or unexpected calcification. Skull: No evidence for fracture. No worrisome lytic or sclerotic lesion. Sinuses/Orbits: The visualized paranasal sinuses and mastoid air cells are clear. Visualized portions of the globes and intraorbital fat are unremarkable. Other: None. CT CERVICAL SPINE FINDINGS Alignment: Normal. Skull base and vertebrae: No acute fracture. No primary bone lesion or focal pathologic process. Soft tissues and spinal canal: No prevertebral fluid or swelling. No visible canal hematoma. Disc levels: Mild loss of disc height noted at C5-6 with endplate spurring, similar to prior. Upper chest: Negative. Other: None. IMPRESSION: 1. No acute intracranial abnormality. 2. Atrophy with chronic small vessel white matter ischemic disease. 3. No cervical spine fracture with stable degenerative changes at C5-6. Electronically Signed   By: Misty Stanley M.D.   On: 04/12/2019 16:24   Ct Cervical Spine Wo Contrast  Result Date: 04/12/2019 CLINICAL DATA:  Patient fell backwards and hit back of head on coffee table. Posterior scalp laceration. EXAM: CT HEAD WITHOUT CONTRAST CT CERVICAL SPINE WITHOUT CONTRAST TECHNIQUE: Multidetector CT imaging of the head and cervical spine was performed following the standard protocol without intravenous contrast. Multiplanar CT image reconstructions of the cervical spine were also generated. COMPARISON:  02/21/2019 FINDINGS: CT HEAD FINDINGS Brain: There is no evidence for acute hemorrhage, hydrocephalus, mass lesion, or abnormal extra-axial fluid collection. No definite CT evidence for acute infarction. Diffuse loss of parenchymal volume is consistent with atrophy. Patchy low attenuation in the deep hemispheric and periventricular white matter is nonspecific, but likely reflects chronic  microvascular ischemic demyelination. Vascular: No hyperdense vessel or unexpected calcification. Skull: No evidence for fracture. No worrisome lytic or sclerotic lesion. Sinuses/Orbits: The visualized paranasal sinuses and mastoid air cells are clear. Visualized portions of the globes and intraorbital fat are unremarkable. Other: None. CT CERVICAL SPINE FINDINGS Alignment: Normal. Skull base and vertebrae: No acute fracture. No primary bone lesion or focal pathologic process. Soft tissues and spinal canal: No prevertebral fluid or swelling. No visible canal hematoma. Disc levels: Mild loss of disc height noted at C5-6  with endplate spurring, similar to prior. Upper chest: Negative. Other: None. IMPRESSION: 1. No acute intracranial abnormality. 2. Atrophy with chronic small vessel white matter ischemic disease. 3. No cervical spine fracture with stable degenerative changes at C5-6. Electronically Signed   By: Misty Stanley M.D.   On: 04/12/2019 16:24    Procedures .Marland KitchenLaceration Repair Date/Time: 04/12/2019 4:19 PM Performed by: Deno Etienne, DO Authorized by: Deno Etienne, DO   Consent:    Consent obtained:  Verbal   Consent given by:  Patient and healthcare agent   Risks discussed:  Infection, pain, poor cosmetic result and poor wound healing   Alternatives discussed:  No treatment, delayed treatment and observation Anesthesia (see MAR for exact dosages):    Anesthesia method:  Local infiltration   Local anesthetic:  Lidocaine 2% WITH epi Laceration details:    Location:  Scalp   Scalp location:  L parietal   Length (cm):  8 Repair type:    Repair type:  Simple Pre-procedure details:    Preparation:  Patient was prepped and draped in usual sterile fashion Exploration:    Hemostasis achieved with:  Direct pressure and epinephrine   Wound exploration: wound explored through full range of motion     Wound extent: no muscle damage noted and no vascular damage noted     Wound extent comment:   Exposed galea without injury Treatment:    Area cleansed with:  Saline   Amount of cleaning:  Extensive   Irrigation solution:  Sterile saline   Irrigation volume:  100   Irrigation method:  Syringe   Visualized foreign bodies/material removed: no   Skin repair:    Repair method:  Staples   Number of staples:  8 Approximation:    Approximation:  Close Post-procedure details:    Dressing:  Open (no dressing)   Patient tolerance of procedure:  Tolerated well, no immediate complications   (including critical care time)  Medications Ordered in ED Medications  lidocaine-EPINEPHrine (XYLOCAINE W/EPI) 2 %-1:100000 (with pres) injection 20 mL (20 mLs Infiltration Given 04/12/19 1606)  acetaminophen (TYLENOL) tablet 1,000 mg (1,000 mg Oral Given 04/12/19 1646)  Tdap (BOOSTRIX) injection 0.5 mL (0.5 mLs Intramuscular Given 04/12/19 1645)     Initial Impression / Assessment and Plan / ED Course  I have reviewed the triage vital signs and the nursing notes.  Pertinent labs & imaging results that were available during my care of the patient were reviewed by me and considered in my medical decision making (see chart for details).        63 yo F with a chief complaint of head laceration.  Patient had a fall, has had multiple falls here over the past 3 or 4 months.  No other issues per the family.  Will obtain a CT of the head and the C-spine as the patient is limited in her history.  We will staple the laceration.  CT scan read is negative.  We will discharge the patient home.  She has follow-up with a neurologist on Monday.  4:53 PM:  I have discussed the diagnosis/risks/treatment options with the patient and family and believe the pt to be eligible for discharge home to follow-up with PCP. We also discussed returning to the ED immediately if new or worsening sx occur. We discussed the sx which are most concerning (e.g., redness, draining fever) that necessitate immediate return. Medications  administered to the patient during their visit and any new prescriptions provided to the patient are listed  below.  Medications given during this visit Medications  lidocaine-EPINEPHrine (XYLOCAINE W/EPI) 2 %-1:100000 (with pres) injection 20 mL (20 mLs Infiltration Given 04/12/19 1606)  acetaminophen (TYLENOL) tablet 1,000 mg (1,000 mg Oral Given 04/12/19 1646)  Tdap (BOOSTRIX) injection 0.5 mL (0.5 mLs Intramuscular Given 04/12/19 1645)     The patient appears reasonably screen and/or stabilized for discharge and I doubt any other medical condition or other [facility] requiring further screening, evaluation, or treatment in the ED at this time prior to discharge.    Final Clinical Impressions(s) / ED Diagnoses   Final diagnoses:  Scalp laceration, initial encounter  Fall in home, initial encounter    ED Discharge Orders    None       Deno Etienne, DO 04/12/19 1653

## 2019-04-12 NOTE — ED Notes (Signed)
Daughter Marguerite Olea [phone]

## 2019-04-12 NOTE — Discharge Instructions (Addendum)
Follow up with your neurologist on Monday and discuss the fall.  Follow up with your PCP in 5-7 days for removal.  No full water immersion, return for redness, drainage, fever

## 2019-04-14 ENCOUNTER — Encounter: Payer: Self-pay | Admitting: Neurology

## 2019-04-14 ENCOUNTER — Other Ambulatory Visit: Payer: Self-pay

## 2019-04-14 ENCOUNTER — Ambulatory Visit (INDEPENDENT_AMBULATORY_CARE_PROVIDER_SITE_OTHER): Payer: Medicare Other | Admitting: Neurology

## 2019-04-14 DIAGNOSIS — R269 Unspecified abnormalities of gait and mobility: Secondary | ICD-10-CM

## 2019-04-14 DIAGNOSIS — G3101 Pick's disease: Secondary | ICD-10-CM | POA: Diagnosis not present

## 2019-04-14 DIAGNOSIS — F0280 Dementia in other diseases classified elsewhere without behavioral disturbance: Secondary | ICD-10-CM | POA: Diagnosis not present

## 2019-04-14 MED ORDER — QUETIAPINE FUMARATE 25 MG PO TABS: 25.0000 mg | ORAL_TABLET | Freq: Every day | ORAL | 2 refills | Status: DC

## 2019-04-14 MED ORDER — TRAMADOL HCL 50 MG PO TABS: 50.0000 mg | ORAL_TABLET | Freq: Four times a day (QID) | ORAL | 0 refills | Status: DC | PRN

## 2019-04-14 NOTE — Progress Notes (Addendum)
     Virtual Visit via Telephone Note  I connected with [name] on 04/14/19 at  3:30 PM EDT by telephone and verified that I am speaking with the correct person using two identifiers.   I discussed the limitations, risks, security and privacy concerns of performing an evaluation and management service by telephone and the availability of in person appointments. I also discussed with the patient that there may be a patient responsible charge related to this service. The patient expressed understanding and agreed to proceed.  The interview was done through the husband, the patient is at home, physician in the office.   History of Present Illness: Ann Wright is a 63 year old right-handed white female with a history of a primary progressive aphasia.  The patient has been mute for over a year.  The patient has had a significant alteration in her ability to walk over the last 2 months.  The patient initially had problems with apraxia, she would miss a chair when she tried to sit or would freeze when she tried to go up a flight of stairs.  The patient now has significant difficulty even walking on a level floor.  She is requiring 24/7 supervision.  She is not sleeping well at night.  She has fallen and bumped her head on 21 February 2019 and on 12 April 2019.  The patient is requiring some assistance with feeding, she has had a lot of constipation issues, she has been treated for recent urinary tract infection.  She has not had any recent blood work done.  She is on Lexapro for some mild agitation at times.  The patient is not able to communicate verbally at this point.   Observations/Objective: The telephone interview was done through the husband.  The patient is nonverbal.  Assessment and Plan: 1.  Primary progressive aphasia  2.  Dementia, apraxia  3.  Gait disorder  The patient has had a significant alteration in her physical capabilities over the last 2 months.  I would recommend we  get blood work to include a comprehensive metabolic profile, CBC and differential, vitamin B12 level, and a thyroid profile.  The patient will be going to the primary care physician office for removal of staples, the husband wants to get the blood work through the primary care doctor.  I fear that the patient is becoming end-stage with her neurodegenerative process.  A prescription was given for Seroquel to take in the evening hours, 25 mg.  Ultram in low-dose was given as needed for pain.  Follow Up Instructions: Follow-up with the next scheduled visit in August 2020.   I discussed the assessment and treatment plan with the patient. The patient was provided an opportunity to ask questions and all were answered. The patient agreed with the plan and demonstrated an understanding of the instructions.   The patient was advised to call back or seek an in-person evaluation if the symptoms worsen or if the condition fails to improve as anticipated.  I provided 22 minutes of non-face-to-face time during this encounter.   Kathrynn Ducking, MD

## 2019-04-15 ENCOUNTER — Telehealth: Payer: Self-pay | Admitting: Neurology

## 2019-04-15 DIAGNOSIS — E538 Deficiency of other specified B group vitamins: Secondary | ICD-10-CM

## 2019-04-15 DIAGNOSIS — Z5181 Encounter for therapeutic drug level monitoring: Secondary | ICD-10-CM

## 2019-04-15 NOTE — Addendum Note (Signed)
Addended by: Kathrynn Ducking on: 04/15/2019 09:49 AM   Modules accepted: Orders

## 2019-04-15 NOTE — Telephone Encounter (Signed)
I reviewed MD's note from 04/14/19. In the note Dr. Jannifer Franklin recommended a CMP, CBC with diff, Vitamin b 12 and Thyroid panel. I have added orders and will fwd to MD to sign off on.

## 2019-04-15 NOTE — Telephone Encounter (Signed)
I have faxed the blood work orders to Dr. Lysle Rubens ( fax # [phone] confirmation received).  Pt's husband has been notified of orders being faxed.

## 2019-04-15 NOTE — Telephone Encounter (Signed)
I sent a note to Dr. Deforest Hoyles yesterday asking him to get the blood, I will go ahead and fax orders over.

## 2019-04-15 NOTE — Telephone Encounter (Signed)
Pt husband has called re: the visit on yesterday with Dr Jannifer Franklin.  Dr Jannifer Franklin told pt's husband that he would send the order to Dr Husain's office for the blood work to be drawn there.  Husband has called to inform that the order has not been sent yet and Dr Glenna Durand office needs the order this morning.

## 2019-04-15 NOTE — Addendum Note (Signed)
Addended by: Verlin Grills T on: 04/15/2019 09:34 AM   Modules accepted: Orders

## 2019-05-01 ENCOUNTER — Telehealth (HOSPITAL_COMMUNITY): Payer: Self-pay | Admitting: Neurology

## 2019-05-01 NOTE — Telephone Encounter (Signed)
I returned calll from patient`s daughter stating patient has had a significan tdecline in last 24 hrs and is unable to even stand and walk and needs lot of help to walk. 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 She had telephone visit with Dr Jannifer Franklin 04/14/19 for similar concerns following a fall few days ago. Ct head was unremarkable and basic labs were ok but are not there in epic. I recommend she go to nearest Er/hospital for evaluation / admission. She voiced understanding

## 2019-06-28 ENCOUNTER — Other Ambulatory Visit: Payer: Self-pay | Admitting: Neurology

## 2019-07-09 ENCOUNTER — Other Ambulatory Visit: Payer: Self-pay

## 2019-07-10 ENCOUNTER — Encounter: Payer: Self-pay | Admitting: Obstetrics & Gynecology

## 2019-07-10 ENCOUNTER — Ambulatory Visit (INDEPENDENT_AMBULATORY_CARE_PROVIDER_SITE_OTHER): Payer: Medicare Other | Admitting: Obstetrics & Gynecology

## 2019-07-10 VITALS — BP 138/82

## 2019-07-10 DIAGNOSIS — Z1272 Encounter for screening for malignant neoplasm of vagina: Secondary | ICD-10-CM | POA: Diagnosis not present

## 2019-07-10 DIAGNOSIS — Z90710 Acquired absence of both cervix and uterus: Secondary | ICD-10-CM

## 2019-07-10 DIAGNOSIS — Z9289 Personal history of other medical treatment: Secondary | ICD-10-CM

## 2019-07-10 DIAGNOSIS — Z01419 Encounter for gynecological examination (general) (routine) without abnormal findings: Secondary | ICD-10-CM

## 2019-07-10 DIAGNOSIS — Z9079 Acquired absence of other genital organ(s): Secondary | ICD-10-CM

## 2019-07-10 DIAGNOSIS — C541 Malignant neoplasm of endometrium: Secondary | ICD-10-CM

## 2019-07-10 DIAGNOSIS — G3101 Pick's disease: Secondary | ICD-10-CM

## 2019-07-10 DIAGNOSIS — Z90722 Acquired absence of ovaries, bilateral: Secondary | ICD-10-CM

## 2019-07-10 DIAGNOSIS — Z8542 Personal history of malignant neoplasm of other parts of uterus: Secondary | ICD-10-CM

## 2019-07-10 DIAGNOSIS — Z1501 Genetic susceptibility to malignant neoplasm of breast: Secondary | ICD-10-CM

## 2019-07-10 DIAGNOSIS — F0280 Dementia in other diseases classified elsewhere without behavioral disturbance: Secondary | ICD-10-CM

## 2019-07-10 DIAGNOSIS — Z1509 Genetic susceptibility to other malignant neoplasm: Secondary | ICD-10-CM

## 2019-07-10 NOTE — Progress Notes (Signed)
Ann Wright 1956/02/28 [id number]   History:    63 y.o. G2P2L2  Accompanied by daughter and daughter in law.  RP:  New (>3 yrs) patient presenting for annual gyn exam   HPI: Patient with Primary progressive aphasia.   Stage IA Grade 1 endometrioid endometrial cancer.   S/p robotic hysterectomy, BSO, SLN biopsy on 02/01/16. negative LVSI, no myometrial invasion, negative pelvic washings and negative lymph nodes.  On ABprophylaxis for recurrent cystitis.  No pelvic pain currently.  Breasts normal.  Followed by Dr Lysle Rubens.   Past medical history,surgical history, family history and social history were all reviewed and documented in the EPIC chart.  Gynecologic History No LMP recorded. Patient has had a hysterectomy. Contraception: status post hysterectomy Last Pap: 2017? Last mammogram: 01/2019. Results were: Negative Bone Density: Never Colonoscopy: 2017  Obstetric History OB History  Gravida Para Term Preterm AB Living  2 2       2   SAB TAB Ectopic Multiple Live Births               # Outcome Date GA Lbr Len/2nd Weight Sex Delivery Anes PTL Lv  2 Para           1 Para              ROS: A ROS was performed and pertinent positives and negatives are included in the history.  GENERAL: No fevers or chills. HEENT: No change in vision, no earache, sore throat or sinus congestion. NECK: No pain or stiffness. CARDIOVASCULAR: No chest pain or pressure. No palpitations. PULMONARY: No shortness of breath, cough or wheeze. GASTROINTESTINAL: No abdominal pain, nausea, vomiting or diarrhea, melena or bright red blood per rectum. GENITOURINARY: No urinary frequency, urgency, hesitancy or dysuria. MUSCULOSKELETAL: No joint or muscle pain, no back pain, no recent trauma. DERMATOLOGIC: No rash, no itching, no lesions. ENDOCRINE: No polyuria, polydipsia, no heat or cold intolerance. No recent change in weight. HEMATOLOGICAL: No anemia or easy bruising or bleeding. NEUROLOGIC: No headache,  seizures, numbness, tingling or weakness. PSYCHIATRIC: No depression, no loss of interest in normal activity or change in sleep pattern.     Exam:   BP 138/82   There is no height or weight on file to calculate BMI.  General appearance : Well developed well nourished female. No acute distress HEENT: Eyes: no retinal hemorrhage or exudates,  Neck supple, trachea midline, no carotid bruits, no thyroidmegaly Lungs: Clear to auscultation, no rhonchi or wheezes, or rib retractions  Heart: Regular rate and rhythm, no murmurs or gallops Breast:Examined in sitting and supine position were symmetrical in appearance, no palpable masses or tenderness,  no skin retraction, no nipple inversion, no nipple discharge, no skin discoloration, no axillary or supraclavicular lymphadenopathy Abdomen: no palpable masses or tenderness, no rebound or guarding Extremities: no edema or skin discoloration or tenderness  Pelvic: Vulva: Normal             Vagina: No gross lesions or discharge.  Pap reflex done at vaginal vault  Cervix/Uterus absent  Adnexa  Without masses or tenderness  Anus: Normal   Assessment/Plan:  63 y.o. female for annual exam   1. Well female exam with routine gynecological exam Gynecologic exam status post total hysterectomy with BSO.  Pap reflex done at the vaginal vault.  Breast exam within normal limits.  Screening mammogram February 2020 was negative.  Colonoscopy in 2017.  Followed by Dr. Lysle Rubens.  2. Special screening for malignant neoplasms, vagina -  Pap IG w/ reflex to HPV when ASC-U  3. Endometrial ca ([place]) Stage IA Grade 1 endometrioid endometrial cancer.   S/p robotic hysterectomy, BSO, SLN biopsy on 02/01/16. negative LVSI, no myometrial invasion, negative pelvic washings and negative lymph nodes.  Pap reflex done on vaginal vault today.  Pelvic exam normal with no pelvic mass felt.  4. S/P total hysterectomy and BSO (bilateral salpingo-oophorectomy)  5. BRCA1 positive   6. 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 TO PREVENT UTI'S   Princess Bruins MD, 3:26 PM 07/10/2019

## 2019-07-10 NOTE — Patient Instructions (Signed)
1. Well female exam with routine gynecological exam Gynecologic exam status post total hysterectomy with BSO.  Pap reflex done at the vaginal vault.  Breast exam within normal limits.  Screening mammogram February 2020 was negative.  Colonoscopy in 2017.  Followed by Dr. Lysle Rubens.  2. Special screening for malignant neoplasms, vagina - Pap IG w/ reflex to HPV when ASC-U  3. Endometrial ca ([place]) Stage IA Grade 1 endometrioid endometrial cancer.   S/p robotic hysterectomy, BSO, SLN biopsy on 02/01/16. negative LVSI, no myometrial invasion, negative pelvic washings and negative lymph nodes.  Pap reflex done on vaginal vault today.  Pelvic exam normal with no pelvic mass felt.  4. S/P total hysterectomy and BSO (bilateral salpingo-oophorectomy)  5. BRCA1 positive  6. 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 TO PREVENT UTI'S  Ann Wright, it was a pleasure to see you with your daughter and daughter-in-law today.  I will inform you of your results as soon as they are available.

## 2019-07-14 LAB — PAP IG W/ RFLX HPV ASCU

## 2019-07-31 ENCOUNTER — Telehealth: Payer: Self-pay | Admitting: Neurology

## 2019-07-31 MED ORDER — ESCITALOPRAM OXALATE 5 MG/5ML PO SOLN: 20.0000 mg | Freq: Every day | ORAL | 3 refills | Status: DC

## 2019-07-31 NOTE — Telephone Encounter (Signed)
Dr. Jannifer Franklin, I called this patient today to reschedule her 8/13 appointment. I spoke with her husband who cancelled the appointment and explained that the patient is under hospice care. He states that the patient is having a difficult time swallowing pills, and he is wanting to know if the patient's Lexapro can be prescribed in liquid form?

## 2019-07-31 NOTE — Addendum Note (Signed)
Addended by: Kathrynn Ducking on: 07/31/2019 05:20 PM   Modules accepted: Orders

## 2019-07-31 NOTE — Telephone Encounter (Signed)
I called and left a message, the Lexapro does have a liquid form, I will send this in.

## 2019-08-07 ENCOUNTER — Ambulatory Visit: Payer: Medicare Other | Admitting: Neurology

## 2019-08-26 ENCOUNTER — Inpatient Hospital Stay
Admission: EM | Admit: 2019-08-26 | Discharge: 2019-08-31 | DRG: 374 | Disposition: A | Discharge status: Hospice | Attending: Internal Medicine | Admitting: Internal Medicine

## 2019-08-26 ENCOUNTER — Inpatient Hospital Stay

## 2019-08-26 ENCOUNTER — Encounter: Payer: Self-pay | Admitting: Emergency Medicine

## 2019-08-26 ENCOUNTER — Emergency Department

## 2019-08-26 ENCOUNTER — Other Ambulatory Visit: Payer: Self-pay

## 2019-08-26 DIAGNOSIS — Y95 Nosocomial condition: Secondary | ICD-10-CM | POA: Diagnosis present

## 2019-08-26 DIAGNOSIS — F419 Anxiety disorder, unspecified: Secondary | ICD-10-CM | POA: Diagnosis present

## 2019-08-26 DIAGNOSIS — K567 Ileus, unspecified: Secondary | ICD-10-CM | POA: Diagnosis present

## 2019-08-26 DIAGNOSIS — C786 Secondary malignant neoplasm of retroperitoneum and peritoneum: Secondary | ICD-10-CM | POA: Diagnosis not present

## 2019-08-26 DIAGNOSIS — C801 Malignant (primary) neoplasm, unspecified: Secondary | ICD-10-CM | POA: Diagnosis not present

## 2019-08-26 DIAGNOSIS — Z20828 Contact with and (suspected) exposure to other viral communicable diseases: Secondary | ICD-10-CM | POA: Diagnosis present

## 2019-08-26 DIAGNOSIS — R1310 Dysphagia, unspecified: Secondary | ICD-10-CM | POA: Diagnosis present

## 2019-08-26 DIAGNOSIS — Z8543 Personal history of malignant neoplasm of ovary: Secondary | ICD-10-CM | POA: Diagnosis not present

## 2019-08-26 DIAGNOSIS — Z515 Encounter for palliative care: Secondary | ICD-10-CM

## 2019-08-26 DIAGNOSIS — Z9079 Acquired absence of other genital organ(s): Secondary | ICD-10-CM | POA: Diagnosis not present

## 2019-08-26 DIAGNOSIS — R180 Malignant ascites: Secondary | ICD-10-CM | POA: Diagnosis present

## 2019-08-26 DIAGNOSIS — K810 Acute cholecystitis: Secondary | ICD-10-CM | POA: Diagnosis present

## 2019-08-26 DIAGNOSIS — R4701 Aphasia: Secondary | ICD-10-CM | POA: Diagnosis present

## 2019-08-26 DIAGNOSIS — Z7189 Other specified counseling: Secondary | ICD-10-CM

## 2019-08-26 DIAGNOSIS — Z8542 Personal history of malignant neoplasm of other parts of uterus: Secondary | ICD-10-CM

## 2019-08-26 DIAGNOSIS — Z90710 Acquired absence of both cervix and uterus: Secondary | ICD-10-CM | POA: Diagnosis not present

## 2019-08-26 DIAGNOSIS — R188 Other ascites: Secondary | ICD-10-CM

## 2019-08-26 DIAGNOSIS — Z809 Family history of malignant neoplasm, unspecified: Secondary | ICD-10-CM | POA: Diagnosis not present

## 2019-08-26 DIAGNOSIS — Z79899 Other long term (current) drug therapy: Secondary | ICD-10-CM | POA: Diagnosis not present

## 2019-08-26 DIAGNOSIS — R471 Dysarthria and anarthria: Secondary | ICD-10-CM | POA: Diagnosis present

## 2019-08-26 DIAGNOSIS — Z853 Personal history of malignant neoplasm of breast: Secondary | ICD-10-CM

## 2019-08-26 DIAGNOSIS — K5900 Constipation, unspecified: Secondary | ICD-10-CM | POA: Diagnosis not present

## 2019-08-26 DIAGNOSIS — N9489 Other specified conditions associated with female genital organs and menstrual cycle: Secondary | ICD-10-CM | POA: Diagnosis present

## 2019-08-26 DIAGNOSIS — C7989 Secondary malignant neoplasm of other specified sites: Secondary | ICD-10-CM | POA: Diagnosis present

## 2019-08-26 DIAGNOSIS — R627 Adult failure to thrive: Secondary | ICD-10-CM | POA: Diagnosis present

## 2019-08-26 DIAGNOSIS — J189 Pneumonia, unspecified organism: Secondary | ICD-10-CM | POA: Diagnosis not present

## 2019-08-26 DIAGNOSIS — C763 Malignant neoplasm of pelvis: Secondary | ICD-10-CM

## 2019-08-26 DIAGNOSIS — K56609 Unspecified intestinal obstruction, unspecified as to partial versus complete obstruction: Secondary | ICD-10-CM | POA: Diagnosis present

## 2019-08-26 DIAGNOSIS — Z90722 Acquired absence of ovaries, bilateral: Secondary | ICD-10-CM

## 2019-08-26 DIAGNOSIS — E7800 Pure hypercholesterolemia, unspecified: Secondary | ICD-10-CM | POA: Diagnosis present

## 2019-08-26 DIAGNOSIS — E119 Type 2 diabetes mellitus without complications: Secondary | ICD-10-CM | POA: Diagnosis present

## 2019-08-26 DIAGNOSIS — K8010 Calculus of gallbladder with chronic cholecystitis without obstruction: Secondary | ICD-10-CM

## 2019-08-26 LAB — COMPREHENSIVE METABOLIC PANEL
ALT: 10 U/L (ref 0–44)
AST: 17 U/L (ref 15–41)
Albumin: 3.3 g/dL — ABNORMAL LOW (ref 3.5–5.0)
Alkaline Phosphatase: 122 U/L (ref 38–126)
Anion gap: 12 (ref 5–15)
BUN: 6 mg/dL — ABNORMAL LOW (ref 8–23)
CO2: 23 mmol/L (ref 22–32)
Calcium: 9.8 mg/dL (ref 8.9–10.3)
Chloride: 102 mmol/L (ref 98–111)
Creatinine, Ser: 0.8 mg/dL (ref 0.44–1.00)
GFR calc Af Amer: >60 mL/min (ref >60)
GFR calc non Af Amer: >60 mL/min (ref >60)
Glucose, Bld: 124 mg/dL — ABNORMAL HIGH (ref 70–99)
Potassium: 4.1 mmol/L (ref 3.5–5.1)
Sodium: 137 mmol/L (ref 135–145)
Total Bilirubin: 0.5 mg/dL (ref 0.3–1.2)
Total Protein: 7.7 g/dL (ref 6.5–8.1)

## 2019-08-26 LAB — LIPASE, BLOOD: Lipase: 26 U/L (ref 11–51)

## 2019-08-26 LAB — CBC
HCT: 39.5 % (ref 36.0–46.0)
Hemoglobin: 12.6 g/dL (ref 12.0–15.0)
MCH: 25.4 pg — ABNORMAL LOW (ref 26.0–34.0)
MCHC: 31.9 g/dL (ref 30.0–36.0)
MCV: 79.6 fL — ABNORMAL LOW (ref 80.0–100.0)
Platelets: 705 10*3/uL — ABNORMAL HIGH (ref 150–400)
RBC: 4.96 MIL/uL (ref 3.87–5.11)
RDW: 14.7 % (ref 11.5–15.5)
WBC: 7.9 10*3/uL (ref 4.0–10.5)
nRBC: 0 % (ref 0.0–0.2)

## 2019-08-26 LAB — URINALYSIS, COMPLETE (UACMP) WITH MICROSCOPIC
Bacteria, UA: NONE SEEN
Bilirubin Urine: NEGATIVE
Glucose, UA: NEGATIVE mg/dL
Hgb urine dipstick: NEGATIVE
Ketones, ur: 5 mg/dL — AB
Nitrite: NEGATIVE
Protein, ur: NEGATIVE mg/dL
Specific Gravity, Urine: >1.046 — ABNORMAL HIGH (ref 1.005–1.030)
pH: 7 (ref 5.0–8.0)

## 2019-08-26 LAB — GLUCOSE, PLEURAL OR PERITONEAL FLUID: Glucose, Fluid: 65 mg/dL

## 2019-08-26 LAB — SARS CORONAVIRUS 2 BY RT PCR (HOSPITAL ORDER, PERFORMED IN ~~LOC~~ HOSPITAL LAB): SARS Coronavirus 2: NEGATIVE

## 2019-08-26 LAB — ALBUMIN, PLEURAL OR PERITONEAL FLUID: Albumin, Fluid: 2.8 g/dL

## 2019-08-26 MED ORDER — IOHEXOL 300 MG/ML  SOLN
100.0000 mL | Freq: Once | INTRAMUSCULAR | Status: AC | PRN
  Administered 2019-08-26: 100 mL via INTRAVENOUS

## 2019-08-26 MED ORDER — SODIUM CHLORIDE 0.9% FLUSH
3.0000 mL | Freq: Once | INTRAVENOUS | Status: AC
  Administered 2019-08-26: 3 mL via INTRAVENOUS

## 2019-08-26 MED ORDER — SODIUM CHLORIDE 0.9 % IV SOLN
INTRAVENOUS | Status: DC
  Administered 2019-08-26 – 2019-08-30 (×7): via INTRAVENOUS

## 2019-08-26 MED ORDER — DOCUSATE SODIUM 100 MG PO CAPS: 100.0000 mg | ORAL_CAPSULE | Freq: Two times a day (BID) | ORAL | Status: DC | PRN

## 2019-08-26 MED ORDER — HEPARIN SODIUM (PORCINE) 5000 UNIT/ML IJ SOLN
5000.0000 [IU] | Freq: Three times a day (TID) | INTRAMUSCULAR | Status: DC
  Administered 2019-08-26 – 2019-08-31 (×14): 5000 [IU] via SUBCUTANEOUS
  Filled 2019-08-26 (×14): qty 1

## 2019-08-26 NOTE — ED Notes (Addendum)
Per pt husband, pt has a hx of primary progressive aphasia and over the past couple of weeks has had a decrease in the pt appetite with c/o of upper abd pain with some N/V, states over the past 2 weeks he has had to give her a suppository or enema to have a BM, last BM was yesterday. Pt is NON verbal but came give some hand signals, stands with assist to turn and pivot.

## 2019-08-26 NOTE — ED Notes (Signed)
Attempted to call report per floor room has been recalled because bed was dirty and being cleaned now. Will inform charge.

## 2019-08-26 NOTE — Progress Notes (Signed)
Family Meeting Note  Advance Directive:yes  Today a meeting took place with the spouse.  Patient is unable to participate due [id number] capacity Aphasic.   The following clinical team members were present during this meeting:MD  The following were discussed:Patient's diagnosis: Primary progressive aphasia, functional decline, progressive dysphagia now, history of ovarian and breast cancer, Patient's progosis: Unable to determine and Goals for treatment: Full Code  Additional follow-up to be provided: Palliative care, Surgery  Time spent during discussion:20 minutes  Vaughan Basta, MD

## 2019-08-26 NOTE — ED Notes (Signed)
ED TO INPATIENT HANDOFF REPORT  ED Nurse Name and Phone #: Mac [phone]  S Name/Age/Gender Ann Wright 63 y.o. female Room/Bed: ED05A/ED05A  Code Status   Code Status: Not on file  Home/SNF/Other Home Patient oriented to: self Is this baseline? Yes   Triage Complete: Triage complete  Chief Complaint bile blockage  Triage Note Patient with expressive aphasia .  Husband says she may have bowel blockage.  Vomiting and cant have bm without suppository.  Says she had abdominal pain yesterday.     Allergies No Known Allergies  Level of Care/Admitting Diagnosis ED Disposition    ED Disposition Condition [facility] Area: [place] [[id number]]  Level of Care: Med-Surg [16]  Covid Evaluation: Asymptomatic Screening Protocol (No Symptoms)  Diagnosis: Small bowel obstruction [facility]) [[id number]]  Admitting Physician: Vaughan Basta [phone]  Attending Physician: Vaughan Basta [phone]  Estimated length of stay: past midnight tomorrow  Certification:: I certify this patient will need inpatient services for at least 2 midnights  PT Class (Do Not Modify): Inpatient [101]  PT Acc Code (Do Not Modify): Private [1]       B Medical/Surgery History Past Medical History:  Diagnosis Date  . Anxiety   . Cancer ([place]) 1987   Breast - no chemotherapy required  . Complication of anesthesia    "I had a hard time of waking up."  . Diverticulitis    x 2 episodes- none recent  . Gait abnormality 02/06/2019  . High cholesterol   . 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 Primary progressive aphasia [facility])    Past Surgical History:  Procedure Laterality Date  . BREAST BIOPSY    . BREAST LUMPECTOMY Right 1987   malignant  . COLONOSCOPY    . COLONOSCOPY WITH PROPOFOL N/A 09/12/2016   Procedure: COLONOSCOPY WITH PROPOFOL;  Surgeon: Garlan Fair, MD;  Location: [facility];   Service: Endoscopy;  Laterality: N/A;  . DILITATION & CURRETTAGE/HYSTROSCOPY WITH VERSAPOINT RESECTION N/A 01/03/2016   Procedure: DILATATION & CURETTAGE/HYSTEROSCOPY WITH VERSAPOINT RESECTION;  Surgeon: Princess Bruins, MD;  Location: [facility];  Service: Gynecology;  Laterality: N/A;     A IV Location/Drains/Wounds Patient Lines/Drains/Airways Status   Active Line/Drains/Airways    Name:   Placement date:   Placement time:   Site:   Days:   Peripheral IV 08/26/19 Left Antecubital   08/26/19    1007    Antecubital   less than 1   Incision (Closed) 01/03/16 Perineum   01/03/16    1506     1331          Intake/Output Last 24 hours  Intake/Output Summary (Last 24 hours) at 08/26/2019 1711 Last data filed at 08/26/2019 1552 Gross per 24 hour  Intake -  Output 300 ml  Net -300 ml    Labs/Imaging Results for orders placed or performed during the hospital encounter of 08/26/19 (from the past 48 hour(s))  Lipase, blood     Status: None   Collection Time: 08/26/19  9:12 AM  Result Value Ref Range   Lipase 26 11 - 51 U/L    Comment: Performed at [facility], [address] [place], [place] [address]  Comprehensive metabolic panel     Status: Abnormal   Collection Time: 08/26/19  9:12 AM  Result Value Ref Range   Sodium 137 135 - 145 mmol/L   Potassium 4.1 3.5 - 5.1  mmol/L   Chloride 102 98 - 111 mmol/L   CO2 23 22 - 32 mmol/L   Glucose, Bld 124 (H) 70 - 99 mg/dL   BUN 6 (L) 8 - 23 mg/dL   Creatinine, Ser 0.80 0.44 - 1.00 mg/dL   Calcium 9.8 8.9 - 10.3 mg/dL   Total Protein 7.7 6.5 - 8.1 g/dL   Albumin 3.3 (L) 3.5 - 5.0 g/dL   AST 17 15 - 41 U/L   ALT 10 0 - 44 U/L   Alkaline Phosphatase 122 38 - 126 U/L   Total Bilirubin 0.5 0.3 - 1.2 mg/dL   GFR calc non Af Amer >60 >60 mL/min   GFR calc Af Amer >60 >60 mL/min   Anion gap 12 5 - 15    Comment: Performed at [facility], [address] [place], [place] [address]  CBC     Status: Abnormal   Collection  Time: 08/26/19  9:12 AM  Result Value Ref Range   WBC 7.9 4.0 - 10.5 K/uL   RBC 4.96 3.87 - 5.11 MIL/uL   Hemoglobin 12.6 12.0 - 15.0 g/dL   HCT 39.5 36.0 - 46.0 %   MCV 79.6 (L) 80.0 - 100.0 fL   MCH 25.4 (L) 26.0 - 34.0 pg   MCHC 31.9 30.0 - 36.0 g/dL   RDW 14.7 11.5 - 15.5 %   Platelets 705 (H) 150 - 400 K/uL   nRBC 0.0 0.0 - 0.2 %    Comment: Performed at [facility], [address] [place], [place] [address]  Urinalysis, Complete w Microscopic     Status: Abnormal   Collection Time: 08/26/19  9:12 AM  Result Value Ref Range   Color, Urine YELLOW (A) YELLOW   APPearance HAZY (A) CLEAR   Specific Gravity, Urine >1.046 (H) 1.005 - 1.030   pH 7.0 5.0 - 8.0   Glucose, UA NEGATIVE NEGATIVE mg/dL   Hgb urine dipstick NEGATIVE NEGATIVE   Bilirubin Urine NEGATIVE NEGATIVE   Ketones, ur 5 (A) NEGATIVE mg/dL   Protein, ur NEGATIVE NEGATIVE mg/dL   Nitrite NEGATIVE NEGATIVE   Leukocytes,Ua SMALL (A) NEGATIVE   RBC / HPF 0-5 0 - 5 RBC/hpf   WBC, UA 0-5 0 - 5 WBC/hpf   Bacteria, UA NONE SEEN NONE SEEN   Squamous Epithelial / LPF 6-10 0 - 5    Comment: Performed at [facility], [address] [place], [place] [address]  SARS Coronavirus 2 [facility] order, Performed in [facility] hospital lab) Nasopharyngeal Nasopharyngeal Swab     Status: None   Collection Time: 08/26/19 11:27 AM   Specimen: Nasopharyngeal Swab  Result Value Ref Range   SARS Coronavirus 2 NEGATIVE NEGATIVE    Comment: (NOTE) If result is NEGATIVE SARS-CoV-2 target nucleic acids are NOT DETECTED. The SARS-CoV-2 RNA is generally detectable in upper and lower  respiratory specimens during the acute phase of infection. The lowest  concentration of SARS-CoV-2 viral copies this assay can detect is 250  copies / mL. A negative result does not preclude SARS-CoV-2 infection  and should not be used as the sole basis for treatment or other  patient management decisions.  A negative result may occur  with  improper specimen collection / handling, submission of specimen other  than nasopharyngeal swab, presence of viral mutation(s) within the  areas targeted by this assay, and inadequate number of viral copies  (<250 copies / mL). A negative result must be combined with clinical  observations, patient history, and epidemiological information. If result is POSITIVE SARS-CoV-2 target nucleic acids are DETECTED. The SARS-CoV-2 RNA is generally detectable in upper and lower  respiratory specimens dur ing the acute phase of infection.  Positive  results are indicative of active infection with SARS-CoV-2.  Clinical  correlation with patient history and other diagnostic information is  necessary to determine patient infection status.  Positive results do  not rule out bacterial infection or co-infection with other viruses. If result is PRESUMPTIVE POSTIVE SARS-CoV-2 nucleic acids MAY BE PRESENT.   A presumptive positive result was obtained on the submitted specimen  and confirmed on repeat testing.  While 2019 novel coronavirus  (SARS-CoV-2) nucleic acids may be present in the submitted sample  additional confirmatory testing may be necessary for epidemiological  and / or clinical management purposes  to differentiate between  SARS-CoV-2 and other Sarbecovirus currently known to infect humans.  If clinically indicated additional testing with an alternate test  methodology [phone]) is advised. The SARS-CoV-2 RNA is generally  detectable in upper and lower respiratory sp ecimens during the acute  phase of infection. The expected result is Negative. Fact Sheet for Patients:  StrictlyIdeas.no Fact Sheet for Healthcare Providers: BankingDealers.co.za This test is not yet approved or cleared by the [place] FDA and has been authorized for detection and/or diagnosis of SARS-CoV-2 by FDA under an Emergency Use Authorization (EUA).  This EUA  will remain in effect (meaning this test can be used) for the duration of the COVID-19 declaration under Section 564(b)(1) of the Act, 21 U.S.C. section 360bbb-3(b)(1), unless the authorization is terminated or revoked sooner. Performed at [facility], [address] [place], [place] [address]    Ct Abdomen Pelvis W Contrast  Addendum Date: 08/26/2019   ADDENDUM REPORT: 08/26/2019 16:31 ADDENDUM: After continued review, omental nodularity and thickening of the peritoneal surfaces are noted. These findings raise the possibility of peritoneal carcinomatosis and correlation with cytology from recent paracentesis can be performed. Electronically Signed   By: Davina Poke M.D.   On: 08/26/2019 16:31   Result Date: 08/26/2019 CLINICAL DATA:  Acute abdominal pain, nausea, vomiting EXAM: CT ABDOMEN AND PELVIS WITH CONTRAST TECHNIQUE: Multidetector CT imaging of the abdomen and pelvis was performed using the standard protocol following bolus administration of intravenous contrast. CONTRAST:  144m OMNIPAQUE IOHEXOL 300 MG/ML  SOLN COMPARISON:  CT pelvis 02/12/2018 FINDINGS: Lower chest: Moderate left-sided pleural effusion with associated compressive atelectasis. Trace right pleural effusion. Hepatobiliary: Crescentic fluid density collection along the lateral aspect the right hepatic lobe (series 2, image 20) may represent a loculated component of ascites versus subcapsular fluid. Liver otherwise unremarkable. Thickened irregular appearance of the gallbladder with small focus of hyperdensity dependently suggesting cholelithiasis. No intrahepatic biliary dilatation. Pancreas: Suboptimally evaluated secondary to adjacent fluid. Pancreatic parenchyma appears to homogeneously enhance. No pancreatic ductal dilatation. Spleen: Normal in size without focal abnormality. Adrenals/Urinary Tract: Unremarkable adrenal glands. Tiny nonobstructing right renal calculus. No hydronephrosis. Ureters and urinary  bladder appear unremarkable. Stomach/Bowel: Large rectal stool ball with mild circumferential rectal wall thickening. Moderate long segment small bowel wall thickening. Mildly dilated fluid-filled loop of small bowel within the lower abdomen measuring up to 3.0 cm (series 5, image 36) with transition point in the mid abdomen (series 5, image 30). Vascular/Lymphatic: Aortic atherosclerosis. No enlarged abdominal or pelvic lymph nodes. Reproductive: 6.7 x 4.7 cm cystic lesion within the left adnexa. Uterus is not seen, likely surgically absent. Other: Moderate volume ascites throughout the abdomen.  No abdominal wall hernia. No pneumoperitoneum. Musculoskeletal: No acute or significant osseous findings. IMPRESSION: 1. Mildly dilated fluid-filled loop of small bowel with transition point in the central abdomen concerning for developing small bowel obstruction. 2. Moderate long segment small bowel thickening upstream of the mildly dilated loop, which may represent a nonspecific enteritis. 3. Diffuse gallbladder wall thickening with cholelithiasis. Findings raise suspicion for acute cholecystitis. Further evaluation with right upper quadrant ultrasound is recommended. 4. Moderate volume ascites throughout the abdomen. 5. Crescentic area of fluid density along the lateral aspect of the right hepatic lobe which may represent a loculated component of ascites versus subcapsular fluid. 6. Moderate size left-sided pleural effusion. 7. Large volume of stool within the rectum with mild diffuse rectal wall thickening. 8. Interval increase in size of a left adnexal cystic lesion. Further evaluation is recommended with a nonemergent pelvic ultrasound. Electronically Signed: By: Davina Poke M.D. On: 08/26/2019 10:56   US Paracentesis  Result Date: 08/26/2019 INDICATION: Ascites. EXAM: ULTRASOUND GUIDED PARACENTESIS MEDICATIONS: None. COMPLICATIONS: None immediate. PROCEDURE: After obtaining informed consent the left abdomen  was sterilely prepped and draped. Small amount of ascites was present with the largest pocket in the left upper abdomen. A 6 French catheter was advanced into this pocket under ultrasound guidance. There were no complications. Approximately 150 cc of blood-tinged fluid obtained. FINDINGS: A total of approximately 150 cc of blood-tinged fluid was removed. Samples were sent to the laboratory as requested by the clinical team. IMPRESSION: Successful ultrasound-guided paracentesis yielding 150 cc of peritoneal fluid. Electronically Signed   By: Marcello Moores  Register   On: 08/26/2019 15:45    Pending Labs Unresulted Labs (From admission, onward)    Start     Ordered   08/26/19 1543  Albumin, pleural or peritoneal fluid  RELEASE UPON ORDERING,   STAT     08/26/19 1543   08/26/19 1543  Glucose, pleural or peritoneal fluid  RELEASE UPON ORDERING,   STAT     08/26/19 1543   08/26/19 1543  Aerobic Culture (superficial specimen)  RELEASE UPON ORDERING,   STAT     08/26/19 1543   08/26/19 1543  Body fluid cell count with differential  RELEASE UPON ORDERING,   STAT     08/26/19 1543   08/26/19 1543  Gram stain  RELEASE UPON ORDERING,   STAT     08/26/19 1543   08/26/19 1543  Body fluid culture  RELEASE UPON ORDERING,   STAT     08/26/19 1543   Signed and Held  HIV antibody (Routine Testing)  Once,   R     Signed and Held   Signed and Held  Basic metabolic panel  Tomorrow morning,   R     Signed and Held   Signed and Held  CBC  Tomorrow morning,   R     Signed and Held   Signed and Held  CBC  (heparin)  Once,   R    Comments: Baseline for heparin therapy IF NOT ALREADY DRAWN.  Notify MD if PLT < 100 K.    Signed and Held   Signed and Held  Creatinine, serum  (heparin)  Once,   R    Comments: Baseline for heparin therapy IF NOT ALREADY DRAWN.    Signed and Held          Vitals/Pain Today's Vitals   08/26/19 1315 08/26/19 1330 08/26/19 1440 08/26/19 1511  BP:  125/83 134/83 138/90  Pulse: 89 93  92  94  Resp:  19    Temp:      TempSrc:      SpO2: 95% 95% 96% 96%  Weight:      Height:        Isolation Precautions No active isolations  Medications Medications  0.9 %  sodium chloride infusion ( Intravenous New Bag/Given 08/26/19 1557)  sodium chloride flush (NS) 0.9 % injection 3 mL (3 mLs Intravenous Given 08/26/19 1008)  iohexol (OMNIPAQUE) 300 MG/ML solution 100 mL (100 mLs Intravenous Contrast Given 08/26/19 1022)    Mobility walks with person assist High fall risk   Focused Assessments Other   R Recommendations: See Admitting Provider Note  Report given to:   Additional Notes:

## 2019-08-26 NOTE — ED Notes (Signed)
Hospice RN at bedside

## 2019-08-26 NOTE — ED Notes (Signed)
Pt back from US

## 2019-08-26 NOTE — Consult Note (Signed)
Subjective:   CC: Abdominal pain, nausea vomiting, constipation  HPI:  Ann Wright is a 63 y.o. female who was consulted by Jimmye Norman for issue above.  Symptoms were first noted 2 weeks ago.  Initially started with decreased bowel movements, eventually requiring routine enemas in order for patient to have a bowel movement.  This eventually progressed to decreasing appetite, eventual persistent nausea and vomiting.  During all this patient started to slowly complain of diffuse abdominal pain as well.  Exact location and characteristic of pain difficult to characterize secondary to patient's PPA (primary progressive aphasia).  Last bowel movement was yesterday.  Patient eventually was unable to maintain adequate oral intake, and persistent abdominal pain along with nausea vomiting prompted ED work-up.  CT scan showed new onset bilateral pleural effusions ascites increasing left pelvic cyst, as well as a concern for possible small bowel obstruction.  Surgery was consulted for this.  Upon detailed chart review, patient has a history of grade 1 endometrial carcinoma status post hysterectomy bilateral salpingo-oophorectomy in 2017.  Patient has had routine follow-up with her OB/GYN and no concerns for any recurrence or any other issues. She does have a history of positive BRCA1 gene.  Last colonoscopy was in 2017 and per procedure report there was negative for any polyps or any concerning issues.    Her PPA has been routinely followed by her neurologist.  She is currently under hospice care with [facility].  She has been nonverbal for some time and occasionally communicates with hand signals.  Husband states that her comprehension has also slowly been declining.  All the above information obtained from previous chart review as well as husband's report who is at bedside.  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  Past Surgical History:  Past Surgical History:  Procedure Laterality Date  . BREAST BIOPSY    . BREAST LUMPECTOMY Right 1987   malignant  . COLONOSCOPY    . COLONOSCOPY WITH PROPOFOL N/A 09/12/2016   Procedure: COLONOSCOPY WITH PROPOFOL;  Surgeon: Garlan Fair, MD;  Location: [facility];  Service: Endoscopy;  Laterality: N/A;  . DILITATION & CURRETTAGE/HYSTROSCOPY WITH VERSAPOINT RESECTION N/A 01/03/2016   Procedure: DILATATION & CURETTAGE/HYSTEROSCOPY WITH VERSAPOINT RESECTION;  Surgeon: Princess Bruins, MD;  Location: [facility];  Service: Gynecology;  Laterality: N/A;    Family History: family history includes Cancer in her daughter, sister, and sister; Heart attack in her mother; Pneumonia in her father.  Social History:  reports that she has never smoked. She has never used smokeless tobacco. She reports that she does not drink alcohol or use drugs.  Current Medications: Includes Tylenol, Xanax, Lexapro, Claritin, Trimpex  Allergies:  Allergies as of 08/26/2019  . (No Known Allergies)    ROS: Difficult to obtain secondary to patient's aphasia.  All pertinent positive negatives that was able to be obtained via husband noted in HPI  Objective:     BP 138/89   Pulse 88   Temp 98.5 F (36.9 C) (Oral)   Resp 18   Ht 5' 6"  (1.676 m)   Wt 77.1 kg   SpO2 96%   BMI 27.44 kg/m   Constitutional :  alert, cooperative, appears stated age and no distress  Lymphatics/Throat:  no asymmetry, masses, or scars  Respiratory:  clear to auscultation bilaterally  Cardiovascular:  regular rate and rhythm  Gastrointestinal: Soft, no guarding, but increased fullness noted in the epigastric and periumbilical region.  Questionable guarding versus distention.   Musculoskeletal: Steady movement  Skin: Cool and moist   Psychiatric: Normal affect, non-agitated, not confused        LABS:  CMP Latest Ref Rng & Units 08/26/2019 02/12/2018  Glucose 70 - 99 mg/dL 124(H) 202(H)  BUN 8 - 23 mg/dL 6(L) 18  Creatinine 0.44 - 1.00 mg/dL 0.80 0.85  Sodium 135 - 145 mmol/L 137 137  Potassium 3.5 - 5.1 mmol/L 4.1 4.7  Chloride 98 - 111 mmol/L 102 104  CO2 22 - 32 mmol/L 23 23  Calcium 8.9 - 10.3 mg/dL 9.8 9.5  Total Protein 6.5 - 8.1 g/dL 7.7 -  Total Bilirubin 0.3 - 1.2 mg/dL 0.5 -  Alkaline Phos 38 - 126 U/L 122 -  AST 15 - 41 U/L 17 -  ALT 0 - 44 U/L 10 -   CBC Latest Ref Rng & Units 08/26/2019 02/12/2018 12/31/2015  WBC 4.0 - 10.5 K/uL 7.9 12.3(H) 8.0  Hemoglobin 12.0 - 15.0 g/dL 12.6 12.8 14.0  Hematocrit 36.0 - 46.0 % 39.5 38.1 40.8  Platelets 150 - 400 K/uL 705(H) 405 331    RADS: CLINICAL DATA:  Acute abdominal pain, nausea, vomiting  EXAM: CT ABDOMEN AND PELVIS WITH CONTRAST  TECHNIQUE: Multidetector CT imaging of the abdomen and pelvis was performed using the standard protocol following bolus administration of intravenous contrast.  CONTRAST:  11m OMNIPAQUE IOHEXOL 300 MG/ML  SOLN  COMPARISON:  CT pelvis 02/12/2018  FINDINGS: Lower chest: Moderate left-sided pleural effusion with associated compressive atelectasis. Trace right pleural effusion.  Hepatobiliary: Crescentic fluid density collection along the lateral aspect the right hepatic lobe (series 2, image 20) may represent a loculated component of ascites versus subcapsular fluid. Liver otherwise unremarkable. Thickened irregular appearance of the gallbladder with small focus of hyperdensity dependently suggesting cholelithiasis. No intrahepatic biliary dilatation.  Pancreas: Suboptimally evaluated secondary to adjacent fluid. Pancreatic parenchyma appears to homogeneously enhance. No pancreatic ductal dilatation.  Spleen: Normal in size without focal abnormality.  Adrenals/Urinary Tract: Unremarkable adrenal glands. Tiny nonobstructing right renal calculus. No hydronephrosis.  Ureters and urinary bladder appear unremarkable.  Stomach/Bowel: Large rectal stool ball with mild circumferential rectal wall thickening. Moderate long segment small bowel wall thickening. Mildly dilated fluid-filled loop of small bowel within the lower abdomen measuring up to 3.0 cm (series 5, image 36) with transition point in the mid abdomen (series 5, image 30).  Vascular/Lymphatic: Aortic atherosclerosis. No enlarged abdominal or pelvic lymph nodes.  Reproductive: 6.7 x 4.7 cm cystic lesion within the left adnexa. Uterus is not seen, likely surgically absent.  Other: Moderate volume ascites throughout the abdomen. No abdominal wall hernia. No pneumoperitoneum.  Musculoskeletal: No acute or significant osseous findings.  IMPRESSION: 1. Mildly dilated fluid-filled loop of small bowel with transition point in the central abdomen concerning for developing small bowel obstruction. 2. Moderate long segment small bowel thickening upstream of the mildly dilated loop, which may represent a nonspecific enteritis. 3. Diffuse gallbladder wall thickening with cholelithiasis. Findings raise suspicion for acute cholecystitis. Further evaluation with right upper quadrant ultrasound is recommended. 4. Moderate volume ascites throughout the abdomen. 5. Crescentic area of fluid density along the lateral aspect of the right hepatic lobe which may represent a loculated component of ascites versus subcapsular fluid. 6. Moderate size left-sided pleural effusion. 7. Large volume of stool within the rectum with mild diffuse rectal wall thickening. 8. Interval increase in  size of a left adnexal cystic lesion. Further evaluation is recommended with a nonemergent pelvic ultrasound.   Electronically Signed   By: Davina Poke M.D.   On: 08/26/2019 10:56 Assessment:   Abdominal pain, nausea vomiting, constipation CT findings of new onset ascites, bilateral pleural effusion,  enlarging left pelvic cyst, questionable small bowel obstruction, questionable chronic cholecystitis.  Plan:    After discussion of the imaging result findings with the radiologist, unsure what the origin of the pelvic cyst is since she had both oh ovaries removed back in 2017.  The questionable small bowel obstruction and transition point along with the concerns for chronic cholecystitis likely is a reactive process to the new diffuse ascites.  The gradual onset of her symptoms also makes it unlikely this is a acute obstructive process caused by adhesions.    Patient currently is stable, no obvious signs of peritonitis, and is having some regular bowel movements all bite with some help with enemas.  The more pressing issues currently with this patient is to figure out the origins of the new findings noted on the CT scan.  Her previous history of endometrial cancer and positive history of BRCA1 is concerning for the origin of the ascites to be malignant in nature.  No immediate surgical intervention is needed at this time.  My initial recommendation is a potential paracentesis for ascites fluid collection and cytology work-up to evaluate for possible malignancy as the cause of all of the above.  I briefly discussed the findings of the CT scan with the husband and recommended no immediate surgical intervention.  He verbalized understanding and stated that he wishes to pursue a diagnosis at this time despite her hospice status.  Further work-up and management per hospitalist team.  Surgery will peripherally follow.  Please call us with any new questions or concerns.  Of note, case was briefly discussed with OB/GYN and since she has already undergone a hysterectomy with BSO, unlikely for them to have any significant role in the diagnosis work-up.

## 2019-08-26 NOTE — Progress Notes (Signed)
ED visit made. Patient is currently followed by [facility] at home with a hospice diagnosis of Picks Disease, she is a full code. Patient was brought to the [facility] ED from home via [place] by her husband for assessment of vomiting and and abdominal pain. CT in the ED has revealed several abnormalities including ascites, left sided pleural effusion, possible bowel obstruction and constipation, surgery was consulted and have met with patient and her husband. Plan is for admission. Patient seen lying on the ED stretcher, husband at bedside. Patient was alert, nonverbal, this is baseline, no nonverbal s/s of pain. Discussed planned admission with Ann Wright, he was able to relate the basic findings of the CT scan results and wishes to proceed with "finding out" what the cause is. He wishes to remain with Ann Wright until she is in a room and then hopes for his step son or daughter to be able to stay. Emotional support given along with writer's contact number. Will continue to follow and update hospice team. Flo Shanks BSN, RN, [facility] [phone]

## 2019-08-26 NOTE — ED Notes (Signed)
Surgeon at bedside speaking with family

## 2019-08-26 NOTE — ED Provider Notes (Signed)
[facility] Emergency Department Provider Note       Time seen: ----------------------------------------- 9:19 AM on 08/26/2019 ----------------------------------------- Level V caveat: History/ROS limited by aphasia  I have reviewed the triage vital signs and the nursing notes.  HISTORY   Chief Complaint Constipation and Abdominal Pain    HPI Ann Wright is a 63 y.o. female with a history of anxiety, breast cancer, diverticulitis, aphasia who presents to the ED for abdominal pain and vomiting.  Patient has been vomiting and had a bowel movement yesterday with suppository.  Patient's husband states she has a fullness in her upper abdomen.  Patient cannot give any further review of systems or report.  Past Medical History:  Diagnosis Date  . Anxiety   . Cancer ([place]) 1987   Breast - no chemotherapy required  . Complication of anesthesia    "I had a hard time of waking up."  . Diverticulitis    x 2 episodes- none recent  . Gait abnormality 02/06/2019  . High cholesterol   . 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 Primary progressive aphasia [facility])     Patient Active Problem List   Diagnosis Date Noted  . Gait abnormality 02/06/2019  . Endometrial ca ([place]) 01/19/2016  . 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 Anxiety 05/08/2014  . Acquired aphasia 05/08/2014  . Dysarthria 05/08/2014  . Balbuties 05/08/2014    Past Surgical History:  Procedure Laterality Date  . BREAST BIOPSY    . BREAST LUMPECTOMY Right 1987   malignant  . COLONOSCOPY    . COLONOSCOPY WITH PROPOFOL N/A 09/12/2016   Procedure: COLONOSCOPY WITH PROPOFOL;  Surgeon: Garlan Fair, MD;  Location: [facility];  Service: Endoscopy;  Laterality: N/A;  . DILITATION & CURRETTAGE/HYSTROSCOPY WITH VERSAPOINT RESECTION N/A 01/03/2016   Procedure: DILATATION & CURETTAGE/HYSTEROSCOPY WITH  VERSAPOINT RESECTION;  Surgeon: Princess Bruins, MD;  Location: [facility];  Service: Gynecology;  Laterality: N/A;    Allergies Patient has no known allergies.  Social History Social History   Tobacco Use  . Smoking status: Never Smoker  . Smokeless tobacco: Never Used  Substance Use Topics  . Alcohol use: No    Comment: occasional   . Drug use: No    Review of Systems Constitutional: Negative for fever. Cardiovascular: Negative for chest pain. Respiratory: Negative for shortness of breath. Gastrointestinal: Positive for abdominal pain, vomiting Musculoskeletal: Negative for back pain. Skin: Negative for rash. Neurological: Negative for headaches, focal weakness or numbness.  All systems negative/normal/unremarkable except as stated in the HPI  ____________________________________________   PHYSICAL EXAM:  VITAL SIGNS: ED Triage Vitals  Enc Vitals Group     BP 08/26/19 0834 (!) 144/76     Pulse Rate 08/26/19 0834 (!) 101     Resp 08/26/19 0834 16     Temp 08/26/19 0834 98.5 F (36.9 C)     Temp Source 08/26/19 0834 Oral     SpO2 08/26/19 0834 96 %     Weight 08/26/19 0829 175 lb (79.4 kg)     Height --      Head Circumference --      Peak Flow --      Pain Score --      Pain Loc --      Pain Edu? --      Excl. in [place]? --    Constitutional:  Well appearing and in no  distress. Eyes: Conjunctivae are normal. Normal extraocular movements. ENT      Head: Normocephalic and atraumatic.      Nose: No congestion/rhinnorhea.      Mouth/Throat: Mucous membranes are moist.      Neck: No stridor. Cardiovascular: Normal rate, regular rhythm. No murmurs, rubs, or gallops. Respiratory: Normal respiratory effort without tachypnea nor retractions. Breath sounds are clear and equal bilaterally. No wheezes/rales/rhonchi. Gastrointestinal: There is upper abdominal fullness in the epigastrium, hypoactive bowel sounds, upper abdominal tenderness as well Musculoskeletal:  Nontender with normal range of motion in extremities. No lower extremity tenderness nor edema. Neurologic: Nonverbal. No gross focal neurologic deficits are appreciated.  Skin:  Skin is warm, dry and intact. No rash noted. Psychiatric: Mood and affect are normal.  ____________________________________________  ED COURSE:  As part of my medical decision making, I reviewed the following data within the [facility] History obtained from family if available, nursing notes, old chart and ekg, as well as notes from prior ED visits. Patient presented for abdominal pain, we will assess with labs and imaging as indicated at this time.   Procedures  Ann Wright was evaluated in Emergency Department on 08/26/2019 for the symptoms described in the history of present illness. She was evaluated in the context of the global COVID-19 pandemic, which necessitated consideration that the patient might be at risk for infection with the SARS-CoV-2 virus that causes COVID-19. Institutional protocols and algorithms that pertain to the evaluation of patients at risk for COVID-19 are in a state of rapid change based on information released by regulatory bodies including the CDC and federal and state organizations. These policies and algorithms were followed during the patient's care in the ED.  ____________________________________________   LABS (pertinent positives/negatives)  Labs Reviewed  COMPREHENSIVE METABOLIC PANEL - Abnormal; Notable for the following components:      Result Value   Glucose, Bld 124 (*)    BUN 6 (*)    Albumin 3.3 (*)    All other components within normal limits  CBC - Abnormal; Notable for the following components:   MCV 79.6 (*)    MCH 25.4 (*)    Platelets 705 (*)    All other components within normal limits  SARS CORONAVIRUS 2 (HOSPITAL ORDER, [facility])  LIPASE, BLOOD  URINALYSIS, COMPLETE (UACMP) WITH MICROSCOPIC     RADIOLOGY Images were viewed by me  CT the abdomen pelvis with contrast IMPRESSION:  1. Mildly dilated fluid-filled loop of small bowel with transition  point in the central abdomen concerning for developing small bowel  obstruction.  2. Moderate long segment small bowel thickening upstream of the  mildly dilated loop, which may represent a nonspecific enteritis.  3. Diffuse gallbladder wall thickening with cholelithiasis. Findings  raise suspicion for acute cholecystitis. Further evaluation with  right upper quadrant ultrasound is recommended.  4. Moderate volume ascites throughout the abdomen.  5. Crescentic area of fluid density along the lateral aspect of the  right hepatic lobe which may represent a loculated component of  ascites versus subcapsular fluid.  6. Moderate size left-sided pleural effusion.  7. Large volume of stool within the rectum with mild diffuse rectal  wall thickening.  8. Interval increase in size of a left adnexal cystic lesion.  Further evaluation is recommended with a nonemergent pelvic  ultrasound.  ____________________________________________   DIFFERENTIAL DIAGNOSIS   Constipation, gas pain, GERD, pancreatitis, obstruction  FINAL ASSESSMENT AND PLAN  Small bowel  obstruction, possible cholecystitis, ascites, pleural effusion, constipation, ovarian cyst  Plan: The patient had presented for abdominal pain. Patient's labs were surprisingly normal. Patient's imaging revealed numerous abnormalities including small bowel obstruction, possible cholecystitis, enteritis, moderate ascites, pleural effusion, constipation and possible left ovarian mass.  I have consulted general surgery for evaluation, it is unclear whether she has occult cancer causing this problem primarily.  She may eventually require surgery for some of these issues but appears to have multiple issues that need to be addressed.   Laurence Aly, MD    Note: This note was  generated in part or whole with voice recognition software. Voice recognition is usually quite accurate but there are transcription errors that can and very often do occur. I apologize for any typographical errors that were not detected and corrected.     Earleen Newport, MD 08/26/19 1125

## 2019-08-26 NOTE — ED Notes (Signed)
Patient transported to Ultrasound 

## 2019-08-26 NOTE — H&P (Signed)
[facility] [place] NAME: Ann Wright    MR#:  [id number]  DATE OF BIRTH:  30-Oct-1956  DATE OF ADMISSION:  08/26/2019  PRIMARY CARE PHYSICIAN: Wenda Low, MD   REQUESTING/REFERRING PHYSICIAN: williams  CHIEF COMPLAINT:   Chief Complaint  Patient presents with  . Constipation  . Abdominal Pain    HISTORY OF PRESENT ILLNESS: Chardonnay Casanova  is a 63 y.o. 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  She also have problems with her balancing so she needs 24-hour supervision and assistance on getting up and walking with support he cannot coordinate well while eating so she need to be fed.  Last few weeks to months she has gradual decline and cannot swallow solid food properly so husband had now switched to more liquid in her diet. For last few days she has not been eating much and was giving signs that she is hurting in her abdomen though she cannot talk but she can sign to husband.  He also felt there is some node in her abdomen, concerned with this he brought her to the emergency room.  CT scan abdomen was done which showed some adnexal cyst and some ascites with questionable partial small bowel obstruction and possibility of acute cholecystitis.  As per husband they gave her some suppository yesterday and she had a good bowel movement last night and she slept peacefully after that She is seen by surgical service who suggested as she had bowel movement they do not need to do any urgent surgery and there is no signs of adhesions.  They are not thinking that this is acute cholecystitis, and suggested she might need more work-up about her increasing cyst and ascites which could be cancer related, husband had agreed to pursue further diagnostic work-up so ER suggested to  admit to medical services.  PAST MEDICAL HISTORY:   Past Medical History:  Diagnosis Date  . Anxiety   . Cancer ([place]) 1987   Breast - no chemotherapy required  . Complication of anesthesia    "I had a hard time of waking up."  . Diverticulitis    x 2 episodes- none recent  . Gait abnormality 02/06/2019  . High cholesterol   . Primary progressive aphasia ([place])    limited speech" does understand verbal commands" per spouse  . Primary progressive aphasia ([place])     PAST SURGICAL HISTORY:  Past Surgical History:  Procedure Laterality Date  . BREAST BIOPSY    . BREAST LUMPECTOMY Right 1987   malignant  . COLONOSCOPY    . COLONOSCOPY WITH PROPOFOL N/A 09/12/2016   Procedure: COLONOSCOPY WITH PROPOFOL;  Surgeon: Garlan Fair, MD;  Location: [facility];  Service: Endoscopy;  Laterality: N/A;  . DILITATION & CURRETTAGE/HYSTROSCOPY WITH VERSAPOINT RESECTION N/A 01/03/2016   Procedure: DILATATION & CURETTAGE/HYSTEROSCOPY WITH VERSAPOINT RESECTION;  Surgeon: Princess Bruins, MD;  Location: [facility];  Service: Gynecology;  Laterality: N/A;    SOCIAL HISTORY:  Social History   Tobacco Use  . Smoking status: Never Smoker  . Smokeless tobacco: Never Used  Substance Use Topics  . Alcohol use: No    Comment: occasional     FAMILY HISTORY:  Family History  Problem Relation Age of Onset  . Heart attack Mother   . Pneumonia Father   . Cancer Sister  breast  . Cancer Sister        breast  . Cancer Daughter        breast    DRUG ALLERGIES: No Known Allergies  REVIEW OF SYSTEMS:   Patient is nonverbal, cannot give much details.  MEDICATIONS AT HOME:  Prior to Admission medications   Medication Sig Start Date End Date Taking? Authorizing Provider  Acetaminophen (TYLENOL EX ST ARTHRITIS PAIN PO) Take 1 Dose by mouth as needed.   Yes [provider]  ALPRAZolam Duanne Moron) 0.5 MG tablet Take 0.5 mg by mouth at bedtime.  08/05/19  Yes [provider]   escitalopram (LEXAPRO) 5 MG/5ML solution Take 20 mLs (20 mg total) by mouth daily. 07/31/19  Yes Kathrynn Ducking, MD  loratadine (CLARITIN) 10 MG tablet Take 10 tablets by mouth daily.    Yes [provider]  trimethoprim (TRIMPEX) 100 MG tablet Take 100 mg by mouth daily. 08/19/19  Yes [provider]      PHYSICAL EXAMINATION:   VITAL SIGNS: Blood pressure 125/83, pulse 93, temperature 98.5 F (36.9 C), temperature source Oral, resp. rate 19, height 5\' 6"  (1.676 m), weight 77.1 kg, SpO2 95 %.  GENERAL:  63 y.o.-year-old patient lying in the bed with no acute distress.  EYES: Pupils equal, round, reactive to light and accommodation. No scleral icterus. Extraocular muscles intact.  HEENT: Head atraumatic, normocephalic. Oropharynx and nasopharynx clear.  NECK:  Supple, no jugular venous distention. No thyroid enlargement, no tenderness.  LUNGS: Normal breath sounds bilaterally, no wheezing, rales,rhonchi or crepitation. No use of accessory muscles of respiration.  CARDIOVASCULAR: S1, S2 normal. No murmurs, rubs, or gallops.  ABDOMEN: Soft, mild tender, some distended. Bowel sounds present. No organomegaly or mass.  EXTREMITIES: No pedal edema, cyanosis, or clubbing.  NEUROLOGIC: Cranial nerves II through XII are intact.  Does not talk and does not track my finger with her eyes.  Muscle strength 3/5 in all extremities. Sensation and gait not checked.  PSYCHIATRIC: The patient is alert and oriented x 0-not able to check her orientation as she does not talk.  SKIN: No obvious rash, lesion, or ulcer.   LABORATORY PANEL:   CBC Recent Labs  Lab 08/26/19 0912  WBC 7.9  HGB 12.6  HCT 39.5  PLT 705*  MCV 79.6*  MCH 25.4*  MCHC 31.9  RDW 14.7   ------------------------------------------------------------------------------------------------------------------  Chemistries  Recent Labs  Lab 08/26/19 0912  NA 137  K 4.1  CL 102  CO2 23  GLUCOSE 124*  BUN 6*   CREATININE 0.80  CALCIUM 9.8  AST 17  ALT 10  ALKPHOS 122  BILITOT 0.5   ------------------------------------------------------------------------------------------------------------------ estimated creatinine clearance is 74.5 mL/min (by C-G formula based on SCr of 0.8 mg/dL). ------------------------------------------------------------------------------------------------------------------ No results for input(s): TSH, T4TOTAL, T3FREE, THYROIDAB in the last 72 hours.  Invalid input(s): FREET3   Coagulation profile No results for input(s): INR, PROTIME in the last 168 hours. ------------------------------------------------------------------------------------------------------------------- No results for input(s): DDIMER in the last 72 hours. -------------------------------------------------------------------------------------------------------------------  Cardiac Enzymes No results for input(s): CKMB, TROPONINI, MYOGLOBIN in the last 168 hours.  Invalid input(s): CK ------------------------------------------------------------------------------------------------------------------ Invalid input(s): POCBNP  ---------------------------------------------------------------------------------------------------------------  Urinalysis No results found for: COLORURINE, APPEARANCEUR, LABSPEC, PHURINE, GLUCOSEU, HGBUR, BILIRUBINUR, KETONESUR, PROTEINUR, UROBILINOGEN, NITRITE, LEUKOCYTESUR   RADIOLOGY: Ct Abdomen Pelvis W Contrast  Result Date: 08/26/2019 CLINICAL DATA:  Acute abdominal pain, nausea, vomiting EXAM: CT ABDOMEN AND PELVIS WITH CONTRAST TECHNIQUE: Multidetector CT imaging of the abdomen and pelvis was performed using  the standard protocol following bolus administration of intravenous contrast. CONTRAST:  163mL OMNIPAQUE IOHEXOL 300 MG/ML  SOLN COMPARISON:  CT pelvis 02/12/2018 FINDINGS: Lower chest: Moderate left-sided pleural effusion with associated compressive atelectasis.  Trace right pleural effusion. Hepatobiliary: Crescentic fluid density collection along the lateral aspect the right hepatic lobe (series 2, image 20) may represent a loculated component of ascites versus subcapsular fluid. Liver otherwise unremarkable. Thickened irregular appearance of the gallbladder with small focus of hyperdensity dependently suggesting cholelithiasis. No intrahepatic biliary dilatation. Pancreas: Suboptimally evaluated secondary to adjacent fluid. Pancreatic parenchyma appears to homogeneously enhance. No pancreatic ductal dilatation. Spleen: Normal in size without focal abnormality. Adrenals/Urinary Tract: Unremarkable adrenal glands. Tiny nonobstructing right renal calculus. No hydronephrosis. Ureters and urinary bladder appear unremarkable. Stomach/Bowel: Large rectal stool ball with mild circumferential rectal wall thickening. Moderate long segment small bowel wall thickening. Mildly dilated fluid-filled loop of small bowel within the lower abdomen measuring up to 3.0 cm (series 5, image 36) with transition point in the mid abdomen (series 5, image 30). Vascular/Lymphatic: Aortic atherosclerosis. No enlarged abdominal or pelvic lymph nodes. Reproductive: 6.7 x 4.7 cm cystic lesion within the left adnexa. Uterus is not seen, likely surgically absent. Other: Moderate volume ascites throughout the abdomen. No abdominal wall hernia. No pneumoperitoneum. Musculoskeletal: No acute or significant osseous findings. IMPRESSION: 1. Mildly dilated fluid-filled loop of small bowel with transition point in the central abdomen concerning for developing small bowel obstruction. 2. Moderate long segment small bowel thickening upstream of the mildly dilated loop, which may represent a nonspecific enteritis. 3. Diffuse gallbladder wall thickening with cholelithiasis. Findings raise suspicion for acute cholecystitis. Further evaluation with right upper quadrant ultrasound is recommended. 4. Moderate volume  ascites throughout the abdomen. 5. Crescentic area of fluid density along the lateral aspect of the right hepatic lobe which may represent a loculated component of ascites versus subcapsular fluid. 6. Moderate size left-sided pleural effusion. 7. Large volume of stool within the rectum with mild diffuse rectal wall thickening. 8. Interval increase in size of a left adnexal cystic lesion. Further evaluation is recommended with a nonemergent pelvic ultrasound. Electronically Signed   By: Davina Poke M.D.   On: 08/26/2019 10:56    EKG: Orders placed or performed during the hospital encounter of 08/26/19  . ED EKG  . ED EKG    IMPRESSION AND PLAN:  *Abdominal pain, new development of ascites and adnexal cyst enlargement in size. Patient had history of ovarian cancer and surgery. As per surgical doctor this findings could be related to a new cancer and suggested to do paracentesis for diagnosis if husband agrees I discussed at length with husband about these possibilities and he would like to have the diagnosis-so I have requested ultrasound-guided paracentesis.  *Progressive aphasia At baseline patient is on hospice at home for last few weeks because of her progressive aphasia, which has left her debilitated and now she is also feeling some difficulty swallowing food. When it comes to feeding, husband raised the concern that he was okay with hospice but for last 2 to 3 weeks he has increasing difficulty in feeding her and that seems to be the main reason he brought her over here. That is why I would like to have a palliative care consult again to clarify the goals of her care including feeding tube as I feel her dysphagia might continue to get progressively worse because of her underlying neurological problem.  *Acute cholecystitis This was suspected as per the CT scan report.  Patient does not have right upper quadrant tenderness.  Her white cell count is not high. She is seen by surgical  team and they does not feel this is acute cholecystitis and suggested to pursue the diagnosis and work-up for ascites. I would get right upper quadrant ultrasound for better idea about presence of cholecystitis. Currently I will not start on antibiotics as she does not have fever or elevated white cell count.    All the records are reviewed and case discussed with ED provider. Management plans discussed with the patient, family and they are in agreement.  CODE STATUS: Full code  Patient husband was present in the room during my visit.  TOTAL TIME TAKING CARE OF THIS PATIENT: 45 minutes.    Vaughan Basta M.D on 08/26/2019   Between 7am to 6pm - Pager - [phone]  After 6pm go to [email or web address] - password [email or web address] [facility] [facility]  Office  [phone]  CC: Primary care physician; Wenda Low, MD   Note: This dictation was prepared with Dragon dictation along with smaller phrase technology. Any transcriptional errors that result from this process are unintentional.

## 2019-08-26 NOTE — ED Notes (Signed)
Pt checked and dry at this time. Pt assisted to toilet.

## 2019-08-26 NOTE — ED Triage Notes (Signed)
Patient with expressive aphasia .  Husband says she may have bowel blockage.  Vomiting and cant have bm without suppository.  Says she had abdominal pain yesterday.

## 2019-08-27 ENCOUNTER — Encounter: Payer: Self-pay | Admitting: Primary Care

## 2019-08-27 DIAGNOSIS — K56609 Unspecified intestinal obstruction, unspecified as to partial versus complete obstruction: Secondary | ICD-10-CM

## 2019-08-27 DIAGNOSIS — Z7189 Other specified counseling: Secondary | ICD-10-CM

## 2019-08-27 DIAGNOSIS — C801 Malignant (primary) neoplasm, unspecified: Secondary | ICD-10-CM

## 2019-08-27 DIAGNOSIS — K5900 Constipation, unspecified: Secondary | ICD-10-CM

## 2019-08-27 DIAGNOSIS — C786 Secondary malignant neoplasm of retroperitoneum and peritoneum: Principal | ICD-10-CM

## 2019-08-27 DIAGNOSIS — Z515 Encounter for palliative care: Secondary | ICD-10-CM

## 2019-08-27 DIAGNOSIS — R188 Other ascites: Secondary | ICD-10-CM

## 2019-08-27 LAB — GRAM STAIN

## 2019-08-27 LAB — BODY FLUID CELL COUNT WITH DIFFERENTIAL
Eos, Fluid: 0 %
Lymphs, Fluid: 97 %
Monocyte-Macrophage-Serous Fluid: 2 %
Neutrophil Count, Fluid: 1 %
Other Cells, Fluid: 0 %
Total Nucleated Cell Count, Fluid: 519 cu mm

## 2019-08-27 LAB — BASIC METABOLIC PANEL
Anion gap: 10 (ref 5–15)
BUN: 7 mg/dL — ABNORMAL LOW (ref 8–23)
CO2: 23 mmol/L (ref 22–32)
Calcium: 9 mg/dL (ref 8.9–10.3)
Chloride: 104 mmol/L (ref 98–111)
Creatinine, Ser: 0.66 mg/dL (ref 0.44–1.00)
GFR calc Af Amer: >60 mL/min (ref >60)
GFR calc non Af Amer: >60 mL/min (ref >60)
Glucose, Bld: 89 mg/dL (ref 70–99)
Potassium: 3.9 mmol/L (ref 3.5–5.1)
Sodium: 137 mmol/L (ref 135–145)

## 2019-08-27 LAB — CBC
HCT: 34 % — ABNORMAL LOW (ref 36.0–46.0)
Hemoglobin: 10.7 g/dL — ABNORMAL LOW (ref 12.0–15.0)
MCH: 25.2 pg — ABNORMAL LOW (ref 26.0–34.0)
MCHC: 31.5 g/dL (ref 30.0–36.0)
MCV: 80 fL (ref 80.0–100.0)
Platelets: 559 10*3/uL — ABNORMAL HIGH (ref 150–400)
RBC: 4.25 MIL/uL (ref 3.87–5.11)
RDW: 14.8 % (ref 11.5–15.5)
WBC: 5.7 10*3/uL (ref 4.0–10.5)
nRBC: 0 % (ref 0.0–0.2)

## 2019-08-27 MED ORDER — ALPRAZOLAM 0.5 MG PO TABS
0.5000 mg | ORAL_TABLET | Freq: Every day | ORAL | Status: DC
  Administered 2019-08-27 – 2019-08-30 (×4): 0.5 mg via ORAL
  Filled 2019-08-27 (×4): qty 1

## 2019-08-27 MED ORDER — ESCITALOPRAM OXALATE 10 MG PO TABS
20.0000 mg | ORAL_TABLET | Freq: Every day | ORAL | Status: DC
  Administered 2019-08-27 – 2019-08-31 (×5): 20 mg via ORAL
  Filled 2019-08-27 (×5): qty 2

## 2019-08-27 MED ORDER — BISACODYL 10 MG RE SUPP
10.0000 mg | Freq: Every day | RECTAL | Status: DC | PRN
  Administered 2019-08-28: 11:00:00 10 mg via RECTAL
  Filled 2019-08-27: qty 1

## 2019-08-27 NOTE — Progress Notes (Signed)
Visit made. Patient seen being helped back to bed by her husband and staff aide Lurline Idol. Patient did ambulate to the bathroom with assistance. She was settled back in bed and appeared comfortable. Mr. Hubach requested to speak in the hall way. He relayed what he understood from the oncology consult, that the CT results may show cancer, but remains hopeful and awaiting the results for the fluid obtained from the paracentesis yesterday. He requested that there is no conversation regarding cancer in front of Ayleene as he is unsure as to what she can understand and does not want to upset her. She remains on IV fluids and NPO. Emotional support given. Will continue to follow and update hospice team. Flo Shanks BSN, [facility] [phone]

## 2019-08-27 NOTE — Progress Notes (Signed)
[name] at [place] NAME: Ann Wright    MR#:  [id number]  DATE OF BIRTH:  [birth year]  SUBJECTIVE:    REVIEW OF SYSTEMS:   ROS Tolerating Diet: Tolerating PT:   DRUG ALLERGIES:  No Known Allergies  VITALS:  Blood pressure (!) 145/84, pulse 83, temperature 98.6 F (37 C), temperature source Oral, resp. rate 20, height 5\' 6"  (1.676 m), weight 77.1 kg, SpO2 94 %.  PHYSICAL EXAMINATION:   Physical Exam  GENERAL:  63 y.o.-year-old patient lying in the bed with no acute distress.  EYES: Pupils equal, round, reactive to light and accommodation. No scleral icterus. Extraocular muscles intact.  HEENT: Head atraumatic, normocephalic. Oropharynx and nasopharynx clear.  NECK:  Supple, no jugular venous distention. No thyroid enlargement, no tenderness.  LUNGS: Normal breath sounds bilaterally, no wheezing, rales, rhonchi. No use of accessory muscles of respiration.  CARDIOVASCULAR: S1, S2 normal. No murmurs, rubs, or gallops.  ABDOMEN: Soft, nontender, nondistended. Bowel sounds present. No organomegaly or mass.  EXTREMITIES: No cyanosis, clubbing or edema b/l.    NEUROLOGIC: Cranial nerves II through XII are intact. No focal Motor or sensory deficits b/l.   PSYCHIATRIC:  patient is alert and oriented x 3.  SKIN: No obvious rash, lesion, or ulcer.   LABORATORY PANEL:  CBC Recent Labs  Lab 08/27/19 0459  WBC 5.7  HGB 10.7*  HCT 34.0*  PLT 559*    Chemistries  Recent Labs  Lab 08/26/19 0912 08/27/19 0459  NA 137 137  K 4.1 3.9  CL 102 104  CO2 23 23  GLUCOSE 124* 89  BUN 6* 7*  CREATININE 0.80 0.66  CALCIUM 9.8 9.0  AST 17  --   ALT 10  --   ALKPHOS 122  --   BILITOT 0.5  --    Cardiac Enzymes No results for input(s): TROPONINI in the last 168 hours. RADIOLOGY:  Ct Abdomen Pelvis W Contrast  Addendum Date: 08/26/2019   ADDENDUM REPORT: 08/26/2019 16:31 ADDENDUM: After continued review, omental nodularity  and thickening of the peritoneal surfaces are noted. These findings raise the possibility of peritoneal carcinomatosis and correlation with cytology from recent paracentesis can be performed. Electronically Signed   By: Davina Poke M.D.   On: 08/26/2019 16:31   Result Date: 08/26/2019 CLINICAL DATA:  Acute abdominal pain, nausea, vomiting EXAM: CT ABDOMEN AND PELVIS WITH CONTRAST TECHNIQUE: Multidetector CT imaging of the abdomen and pelvis was performed using the standard protocol following bolus administration of intravenous contrast. CONTRAST:  175mL OMNIPAQUE IOHEXOL 300 MG/ML  SOLN COMPARISON:  CT pelvis 02/12/2018 FINDINGS: Lower chest: Moderate left-sided pleural effusion with associated compressive atelectasis. Trace right pleural effusion. Hepatobiliary: Crescentic fluid density collection along the lateral aspect the right hepatic lobe (series 2, image 20) may represent a loculated component of ascites versus subcapsular fluid. Liver otherwise unremarkable. Thickened irregular appearance of the gallbladder with small focus of hyperdensity dependently suggesting cholelithiasis. No intrahepatic biliary dilatation. Pancreas: Suboptimally evaluated secondary to adjacent fluid. Pancreatic parenchyma appears to homogeneously enhance. No pancreatic ductal dilatation. Spleen: Normal in size without focal abnormality. Adrenals/Urinary Tract: Unremarkable adrenal glands. Tiny nonobstructing right renal calculus. No hydronephrosis. Ureters and urinary bladder appear unremarkable. Stomach/Bowel: Large rectal stool ball with mild circumferential rectal wall thickening. Moderate long segment small bowel wall thickening. Mildly dilated fluid-filled loop of small bowel within the lower abdomen measuring up to 3.0 cm (series 5, image 36) with transition point in the mid abdomen (  series 5, image 30). Vascular/Lymphatic: Aortic atherosclerosis. No enlarged abdominal or pelvic lymph nodes. Reproductive: 6.7 x 4.7 cm  cystic lesion within the left adnexa. Uterus is not seen, likely surgically absent. Other: Moderate volume ascites throughout the abdomen. No abdominal wall hernia. No pneumoperitoneum. Musculoskeletal: No acute or significant osseous findings. IMPRESSION: 1. Mildly dilated fluid-filled loop of small bowel with transition point in the central abdomen concerning for developing small bowel obstruction. 2. Moderate long segment small bowel thickening upstream of the mildly dilated loop, which may represent a nonspecific enteritis. 3. Diffuse gallbladder wall thickening with cholelithiasis. Findings raise suspicion for acute cholecystitis. Further evaluation with right upper quadrant ultrasound is recommended. 4. Moderate volume ascites throughout the abdomen. 5. Crescentic area of fluid density along the lateral aspect of the right hepatic lobe which may represent a loculated component of ascites versus subcapsular fluid. 6. Moderate size left-sided pleural effusion. 7. Large volume of stool within the rectum with mild diffuse rectal wall thickening. 8. Interval increase in size of a left adnexal cystic lesion. Further evaluation is recommended with a nonemergent pelvic ultrasound. Electronically Signed: By: Davina Poke M.D. On: 08/26/2019 10:56   US Paracentesis  Result Date: 08/26/2019 INDICATION: Ascites. EXAM: ULTRASOUND GUIDED PARACENTESIS MEDICATIONS: None. COMPLICATIONS: None immediate. PROCEDURE: After obtaining informed consent the left abdomen was sterilely prepped and draped. Small amount of ascites was present with the largest pocket in the left upper abdomen. A 6 French catheter was advanced into this pocket under ultrasound guidance. There were no complications. Approximately 150 cc of blood-tinged fluid obtained. FINDINGS: A total of approximately 150 cc of blood-tinged fluid was removed. Samples were sent to the laboratory as requested by the clinical team. IMPRESSION: Successful  ultrasound-guided paracentesis yielding 150 cc of peritoneal fluid. Electronically Signed   By: Marcello Moores  Register   On: 08/26/2019 15:45   US Abdomen Limited Ruq  Result Date: 08/26/2019 CLINICAL DATA:  Acute cholecystitis, acute abdominal pain, nausea, vomiting EXAM: ULTRASOUND ABDOMEN LIMITED RIGHT UPPER QUADRANT COMPARISON:  CT abdomen and pelvis 08/26/2019 FINDINGS: Gallbladder: Dependent echogenic nonshadowing material throughout the gallbladder, question sludge. Single linear echogenic focus is seen without shadowing, 10 mm length, uncertain etiology. Gallbladder wall upper normal thickness. Minimal pericholecystic fluid, patient with ascites by earlier CT. Possible gallstone at the lower gallbladder segment seen on the earlier CT exam is not definitely delineated. Common bile duct: Diameter: 3 mm, normal Liver: No definite intrahepatic abnormalities. Echogenicity normal. Portal vein is patent on color Doppler imaging with normal direction of blood flow towards the liver. Other: None. IMPRESSION: Significant sludge within gallbladder. No definite gallbladder wall thickening or sonographic Murphy sign to suggest acute cholecystitis. Small gallstone seen at the lower gallbladder segment on the earlier CT exam is not sonographically demonstrated. No additional focal hepatic biliary abnormalities identified. Findings called to Dr. Anselm Jungling on 08/26/2019 at 1703 hours. Electronically Signed   By: Lavonia Dana M.D.   On: 08/26/2019 17:14   ASSESSMENT AND PLAN:   63 y.o. female  with past medical history of history of breast and  endometrial cancer BL salpingo-oophorectomy 2017, Picks Disease, diverticulitis, primary progressive aphasia  admitted on 08/26/2019 with Abdominal pain,new development of ascites and adnexal cyst enlargement   *Abdominal pain, new development of ascites and adnexal cyst enlargement in size. With constipation and partial SBO -Patient had history of endometrial cancer and  surgery. -Status post ultrasound-guided paracentesis with 150 mL blood retained fluid. Cytology pending. -Ecology consultation with Dr. Tasia Catchings appreciated. Depending on pathology  findings there may be a possibility will need to get CT guided biopsy of the momentum -CT abdomen and pelvis shows possible peritoneal carcinomatosis -patient's husband wants diagnosis established. He understands patient has poor prognosis and currently functional status quite poor for any treatment. -Palliative care consultation appreciated--- pt is full code  *Progressive aphasia At baseline patient is on hospice at home for last few weeks because of her progressive aphasia, which has left her debilitated and now she is also feeling some difficulty swallowing food. -Been followed by hospice as outpatient for two weeks  *Constipation with partial small bowel obstruction -no vomiting. Will start patient on clear liquid diet -she has some findings of acute cholecystitis however her LFTs are normal. Seen by Dr. Lysle Pearl in the ER. No surgical plans noted -will give stool softener/enema if okay with surgery.--- Message sent to Dr. Lysle Pearl  *Chronic anxiety -I will resume Xanax and escitalopram  overall carries a very poor prognosis  Case discussed with Care Management/Social Worker. Management plans discussed with the family and they are in agreement.  CODE STATUS: full  DVT Prophylaxis: Lovenox  TOTAL TIME TAKING CARE OF THIS PATIENT: 30 minutes.  >50% time spent on counselling and coordination of care  POSSIBLE D/C IN *few* DAYS, DEPENDING ON CLINICAL CONDITION.  Note: This dictation was prepared with Dragon dictation along with smaller phrase technology. Any transcriptional errors that result from this process are unintentional.  Fritzi Mandes M.D on 08/27/2019 at 3:56 PM  Between 7am to 6pm - Pager - [phone]  After 6pm go to [email or web address] - password [email or web address] [place] [facility]  Office   [phone]  CC: Primary care physician; Wenda Low, MDPatient ID: Ann Wright, female   DOB: 1956-02-13, 63 y.o.   MRN: [id number]

## 2019-08-27 NOTE — Consult Note (Signed)
Consultation Note Date: 08/27/2019   Patient Name: Ann Wright  DOB: [birth year]  MRN: [id number]  Age / Sex: 63 y.o., female  PCP: Wenda Low, MD Referring Physician: Fritzi Mandes, MD  Reason for Consultation: Establishing goals of care and Psychosocial/spiritual support  HPI/Patient Profile: 63 y.o. female  with past medical history of history of breast and ovarian cancer BL salpingo-oophorectomy 2017, Picks Disease, diverticulitis, primary progressive aphasia  admitted on 08/26/2019 with Abdominal pain, new development of ascites and adnexal cyst enlargement .   Clinical Assessment and Goals of Care: Mrs. Scoby is resting quietly in bed.  She will make an somewhat keep eye contact.  She is nonverbal.  Legrand Como is at bedside.  We talked about the plan, and Legrand Como quickly states that they are awaiting input from oncology.  We talked about symptom management, Dulcolax suppository ordered.  Paracentesis performed yesterday with bloody fluid sent for pathology. Surgical consult yesterday, but no surgi cal intervention recommended at this time. Awaiting oncology consult for definitive diagnosis if possible.  Active with [facility] for about 2 weeks.   PMT to follow-up tomorrow after oncology consult.  Conference with nursing staff related to patient condition, needs.   HCPOA   NEXT OF KIN -spouse Legrand Como.  Mrs. Baudo has 2 adult children, not Legrand Como children, son Merrilee Seashore and daughter Ishmael Holter.    SUMMARY OF RECOMMENDATIONS   Awaiting oncology input At this point continue full scope/full code Monitor for declines  Code Status/Advance Care Planning:  Full code  Symptom Management:   Per hospitalist, no additional needs at this time.   Palliative Prophylaxis:   Aspiration, Frequent Pain Assessment and Turn Reposition  Additional Recommendations (Limitations, Scope, Preferences):   Full Scope Treatment  Psycho-social/Spiritual:   Desire for further Chaplaincy support:no  Additional Recommendations: Caregiving  Support/Resources and Education on Hospice  Prognosis:   Unable to determine, based on outcomes.  6 months or less anticipated.  Discharge Planning: To be determined, based on outcomes.      Primary Diagnoses: Present on Admission: . Small bowel obstruction ([place])   I have reviewed the medical record, interviewed the patient and family, and examined the patient. The following aspects are pertinent.  Past Medical History:  Diagnosis Date  . Anxiety   . Cancer ([place]) 1987   Breast - no chemotherapy required  . Complication of anesthesia    "I had a hard time of waking up."  . Diverticulitis    x 2 episodes- none recent  . Gait abnormality 02/06/2019  . High cholesterol   . Primary progressive aphasia ([place])    limited speech" does understand verbal commands" per spouse  . Primary progressive aphasia [facility])    Social History   Socioeconomic History  . Marital status: Married    Spouse name: michael  . Number of children: 2  . Years of education: 41  . Highest education level: Not on file  Occupational History  . Occupation: retired  Scientific laboratory technician  . Financial resource strain: Patient refused  .  Food insecurity    Worry: Patient refused    Inability: Patient refused  . Transportation needs    Medical: No    Non-medical: No  Tobacco Use  . Smoking status: Never Smoker  . Smokeless tobacco: Never Used  Substance and Sexual Activity  . Alcohol use: No    Comment: occasional   . Drug use: No  . Sexual activity: Not Currently  Lifestyle  . Physical activity    Days per week: 0 days    Minutes per session: 0 min  . Stress: Patient refused  Relationships  . Social Herbalist on phone: Patient refused    Gets together: Patient refused    Attends religious service: Patient refused    Active member of club or organization:  Patient refused    Attends meetings of clubs or organizations: Patient refused    Relationship status: Patient refused  Other Topics Concern  . Not on file  Social History Narrative   Lives at home w/ her hsuband   Patient drinks 3 cups of caffeine daily.   Patient is right handed.   Family History  Problem Relation Age of Onset  . Heart attack Mother   . Pneumonia Father   . Cancer Sister        breast  . Cancer Sister        breast  . Cancer Daughter        breast   Scheduled Meds: . heparin  5,000 Units Subcutaneous Q8H   Continuous Infusions: . sodium chloride 75 mL/hr at 08/27/19 0736   PRN Meds:.docusate sodium Medications Prior to Admission:  Prior to Admission medications   Medication Sig Start Date End Date Taking? Authorizing Provider  Acetaminophen (TYLENOL EX ST ARTHRITIS PAIN PO) Take 1 Dose by mouth as needed.   Yes [provider]  ALPRAZolam Duanne Moron) 0.5 MG tablet Take 0.5 mg by mouth at bedtime.  08/05/19  Yes [provider]  escitalopram (LEXAPRO) 5 MG/5ML solution Take 20 mLs (20 mg total) by mouth daily. 07/31/19  Yes Kathrynn Ducking, MD  loratadine (CLARITIN) 10 MG tablet Take 10 tablets by mouth daily.    Yes [provider]  trimethoprim (TRIMPEX) 100 MG tablet Take 100 mg by mouth daily. 08/19/19  Yes [provider]   No Known Allergies Review of Systems  Unable to perform ROS: Patient nonverbal    Physical Exam Vitals signs and nursing note reviewed.  Constitutional:      Appearance: She is normal weight.     Vital Signs: BP (!) 145/84 (BP Location: Left Arm)   Pulse 83   Temp 98.6 F (37 C) (Oral)   Resp 20   Ht 5\' 6"  (1.676 m)   Wt 77.1 kg   SpO2 94%   BMI 27.44 kg/m  Pain Scale: Faces       SpO2: SpO2: 94 % O2 Device:SpO2: 94 % O2 Flow Rate: .   IO: Intake/output summary:   Intake/Output Summary (Last 24 hours) at 08/27/2019 1228 Last data filed at 08/27/2019 0930 Gross per 24 hour   Intake -  Output 600 ml  Net -600 ml    LBM:   Baseline Weight: Weight: 79.4 kg Most recent weight: Weight: 77.1 kg     Palliative Assessment/Data:   Flowsheet Rows     Most Recent Value  Intake Tab  Referral Department  Hospitalist  Unit at Time of Referral  Med/Surg Unit  Palliative Care Primary Diagnosis  Cancer  Date Notified  08/26/19  Palliative Care Type  New Palliative care  Reason for referral  Clarify Goals of Care  Date of Admission  08/26/19  Date first seen by Palliative Care  08/27/19  # of days Palliative referral response time  1 Day(s)  # of days IP prior to Palliative referral  0  Clinical Assessment  Palliative Performance Scale Score  40%  Pain Max last 24 hours  Not able to report  Pain Min Last 24 hours  Not able to report  Dyspnea Max Last 24 Hours  Not able to report  Dyspnea Min Last 24 hours  Not able to report  Psychosocial & Spiritual Assessment  Palliative Care Outcomes      Time In: 1300  Time Out: 1350 Time Total: 50 minutes Greater than 50%  of this time was spent counseling and coordinating care related to the above assessment and plan.  Signed by: Drue Novel, NP   Please contact Palliative Medicine Team phone at [phone] for questions and concerns.  For individual provider: See Shea Evans

## 2019-08-27 NOTE — Consult Note (Signed)
Hematology/Oncology Consult note [facility] [phone] [phone]  Patient Care Team: Wenda Low, MD as PCP - General (Internal Medicine)   Name of the patient: Ann Wright  [id number]  March 11, 1956   Date of visit: 08/27/19 REASON FOR COSULTATION:  Peritoneal carcinomatosis History of presenting illness-  63 y.o. female with PMH listed at below who presents to ER for evaluation of abdominal pain, progressively decreased appetite, persistent nausea and vomiting. Patient has primary progressive aphasia which is affecting her coordination, speech and communication skills, swallowing, and the patient has been on hospice for the past 2 to 3 weeks.  Husband also noticed some abdomen distention for the past few weeks. She reports to have a remote history of breast cancer status post surgery, was treated at [facility].  Pathology and medical records were not available in current care everywhere EMR. 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  Status post robotic hysterectomy, BSO, sentinel lymph node biopsy, negative LVSI, no myometrial invasion.  Negative pelvic washing and negative lymph nodes.  Cancer was felt to be very low risk [less than 5%] given age, grade, depth of myometrial invasion and LVSI status.  Her case was discussed on multidisciplinary tumor board recommendations routine surveillance with pelvic exams. Patient reports having genetic testing done and was positive for BRCA mutations. 08/26/2019 CT abdomen pelvis with contrast showed mildly dilated fluid filled loop of small bowel with transition point in the central abdomen concerning for developing small bowel obstruction. Moderate long segment of small bowel thickening upstream the mildly dilated loop which may represent a nonspecific enteritis. Diffuse gallbladder wall thickening with cholelithiasis.  Finding raises suspicion  for acute cholecystitis. Moderate volume of ascites throughout the abdomen Crescentic area of fluid density along the lateral aspect of the right hepatic lobe which may represent a loculated component of ascites versus subcapsular fluid. Moderate left-sided pleural effusion Large volume of stool in the rectum with mild diffuse rectal wall thickening. Interval increase in size of left adrenal cystic lesion. #There is also addendum reporting omental nodularity and thickening of the peritoneal surface, findings raise possibility of peritoneal carcinomatosis.  Ultrasound right upper quadrant showed no definite gallbladder wall thickening or sonographic Murphy sign to suggest acute cholecystitis. Patient had diagnostic paracentesis on 08/26/2019.  Removed 150 cc of peritoneal fluid.  Cytology pending. Oncology was consulted for evaluation and discussion. Patient lives at home with husband.  She is completely mute.  Not able to provide any history.  Per husband patient has gait instability, cannot walk by herself.  Need assistant walking to bathroom at baseline.  Recently also have swallowing difficulties.   Review of Systems  Unable to perform ROS: Other (Primary progressive aphasia)    No Known Allergies  Patient Active Problem List   Diagnosis Date Noted  . Constipation   . Goals of care, counseling/discussion   . Palliative care by specialist   . SBO (small bowel obstruction) ([place]) 08/26/2019  . Small bowel obstruction ([place]) 08/26/2019  . Gait abnormality 02/06/2019  . Endometrial ca ([place]) 01/19/2016  . 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 Primary progressive aphasia ([place]) 08/09/2015  . Anxiety 05/08/2014  . Acquired aphasia 05/08/2014  . Dysarthria 05/08/2014  . Balbuties 05/08/2014     Past Medical History:  Diagnosis Date  . Anxiety   . Cancer ([place]) 1987   Breast - no chemotherapy required  . Complication of anesthesia    "I had a hard time  of waking up."  . Diverticulitis    x 2  episodes- none recent  . Gait abnormality 02/06/2019  . High cholesterol   . Primary progressive aphasia ([place])    limited speech" does understand verbal commands" per spouse  . Primary progressive aphasia [facility])      Past Surgical History:  Procedure Laterality Date  . BREAST BIOPSY    . BREAST LUMPECTOMY Right 1987   malignant  . COLONOSCOPY    . COLONOSCOPY WITH PROPOFOL N/A 09/12/2016   Procedure: COLONOSCOPY WITH PROPOFOL;  Surgeon: Garlan Fair, MD;  Location: [facility];  Service: Endoscopy;  Laterality: N/A;  . DILITATION & CURRETTAGE/HYSTROSCOPY WITH VERSAPOINT RESECTION N/A 01/03/2016   Procedure: DILATATION & CURETTAGE/HYSTEROSCOPY WITH VERSAPOINT RESECTION;  Surgeon: Princess Bruins, MD;  Location: [facility];  Service: Gynecology;  Laterality: N/A;    Social History   Socioeconomic History  . Marital status: Married    Spouse name: michael  . Number of children: 2  . Years of education: 16  . Highest education level: Not on file  Occupational History  . Occupation: retired  Scientific laboratory technician  . Financial resource strain: Patient refused  . Food insecurity    Worry: Patient refused    Inability: Patient refused  . Transportation needs    Medical: No    Non-medical: No  Tobacco Use  . Smoking status: Never Smoker  . Smokeless tobacco: Never Used  Substance and Sexual Activity  . Alcohol use: No    Comment: occasional   . Drug use: No  . Sexual activity: Not Currently  Lifestyle  . Physical activity    Days per week: 0 days    Minutes per session: 0 min  . Stress: Patient refused  Relationships  . Social Herbalist on phone: Patient refused    Gets together: Patient refused    Attends religious service: Patient refused    Active member of club or organization: Patient refused    Attends meetings of clubs or organizations: Patient refused    Relationship status: Patient refused  . Intimate partner violence    Fear of current or ex partner: Not  on file    Emotionally abused: Not on file    Physically abused: Not on file    Forced sexual activity: Not on file  Other Topics Concern  . Not on file  Social History Narrative   Lives at home w/ her hsuband   Patient drinks 3 cups of caffeine daily.   Patient is right handed.     Family History  Problem Relation Age of Onset  . Heart attack Mother   . Pneumonia Father   . Cancer Sister        breast  . Cancer Sister        breast  . Cancer Daughter        breast     Current Facility-Administered Medications:  .  0.9 %  sodium chloride infusion, , Intravenous, Continuous, Vaughan Basta, MD, Last Rate: 75 mL/hr at 08/27/19 1557 .  ALPRAZolam Duanne Moron) tablet 0.5 mg, 0.5 mg, Oral, QHS, Fritzi Mandes, MD, 0.5 mg at 08/27/19 2121 .  bisacodyl (DULCOLAX) suppository 10 mg, 10 mg, Rectal, Daily PRN, Dove, Tasha A, NP .  docusate sodium (COLACE) capsule 100 mg, 100 mg, Oral, BID PRN, Vaughan Basta, MD .  escitalopram (LEXAPRO) tablet 20 mg, 20 mg, Oral, Daily, Fritzi Mandes, MD, 20 mg at 08/27/19 1706 .  heparin injection 5,000 Units,  5,000 Units, Subcutaneous, Q8H, Vaughan Basta, MD, 5,000 Units at 08/27/19 2117   Physical exam:  Vitals:   08/26/19 1831 08/26/19 1849 08/27/19 0514 08/27/19 2008  BP: 133/80 136/87 (!) 145/84 (!) 142/78  Pulse: 89 87 83 92  Resp: 18 18 20 18   Temp: 98.3 F (36.8 C) 99 F (37.2 C) 98.6 F (37 C) 98.1 F (36.7 C)  TempSrc: Oral Oral Oral Oral  SpO2: 96% 94% 94% 96%  Weight:      Height:       Physical Exam  Constitutional: No distress.  HENT:  Head: Normocephalic and atraumatic.  Eyes: Pupils are equal, round, and reactive to light. No scleral icterus.  Cardiovascular: Normal rate.  Pulmonary/Chest: Effort normal.  Abdominal: She exhibits distension.  Neurological: She is alert.  Mute Coordination difficulties, apraxia  Skin: Skin is warm.  Psychiatric: Mood normal.        CMP Latest Ref Rng & Units  08/27/2019  Glucose 70 - 99 mg/dL 89  BUN 8 - 23 mg/dL 7(L)  Creatinine 0.44 - 1.00 mg/dL 0.66  Sodium 135 - 145 mmol/L 137  Potassium 3.5 - 5.1 mmol/L 3.9  Chloride 98 - 111 mmol/L 104  CO2 22 - 32 mmol/L 23  Calcium 8.9 - 10.3 mg/dL 9.0  Total Protein 6.5 - 8.1 g/dL -  Total Bilirubin 0.3 - 1.2 mg/dL -  Alkaline Phos 38 - 126 U/L -  AST 15 - 41 U/L -  ALT 0 - 44 U/L -   CBC Latest Ref Rng & Units 08/27/2019  WBC 4.0 - 10.5 K/uL 5.7  Hemoglobin 12.0 - 15.0 g/dL 10.7(L)  Hematocrit 36.0 - 46.0 % 34.0(L)  Platelets 150 - 400 K/uL 559(H)   RADIOGRAPHIC STUDIES: I have personally reviewed the radiological images as listed and agreed with the findings in the report.  Ct Abdomen Pelvis W Contrast  Addendum Date: 08/26/2019   ADDENDUM REPORT: 08/26/2019 16:31 ADDENDUM: After continued review, omental nodularity and thickening of the peritoneal surfaces are noted. These findings raise the possibility of peritoneal carcinomatosis and correlation with cytology from recent paracentesis can be performed. Electronically Signed   By: Davina Poke M.D.   On: 08/26/2019 16:31   Result Date: 08/26/2019 CLINICAL DATA:  Acute abdominal pain, nausea, vomiting EXAM: CT ABDOMEN AND PELVIS WITH CONTRAST TECHNIQUE: Multidetector CT imaging of the abdomen and pelvis was performed using the standard protocol following bolus administration of intravenous contrast. CONTRAST:  158m OMNIPAQUE IOHEXOL 300 MG/ML  SOLN COMPARISON:  CT pelvis 02/12/2018 FINDINGS: Lower chest: Moderate left-sided pleural effusion with associated compressive atelectasis. Trace right pleural effusion. Hepatobiliary: Crescentic fluid density collection along the lateral aspect the right hepatic lobe (series 2, image 20) may represent a loculated component of ascites versus subcapsular fluid. Liver otherwise unremarkable. Thickened irregular appearance of the gallbladder with small focus of hyperdensity dependently suggesting cholelithiasis.  No intrahepatic biliary dilatation. Pancreas: Suboptimally evaluated secondary to adjacent fluid. Pancreatic parenchyma appears to homogeneously enhance. No pancreatic ductal dilatation. Spleen: Normal in size without focal abnormality. Adrenals/Urinary Tract: Unremarkable adrenal glands. Tiny nonobstructing right renal calculus. No hydronephrosis. Ureters and urinary bladder appear unremarkable. Stomach/Bowel: Large rectal stool ball with mild circumferential rectal wall thickening. Moderate long segment small bowel wall thickening. Mildly dilated fluid-filled loop of small bowel within the lower abdomen measuring up to 3.0 cm (series 5, image 36) with transition point in the mid abdomen (series 5, image 30). Vascular/Lymphatic: Aortic atherosclerosis. No enlarged abdominal or pelvic lymph nodes. Reproductive:  6.7 x 4.7 cm cystic lesion within the left adnexa. Uterus is not seen, likely surgically absent. Other: Moderate volume ascites throughout the abdomen. No abdominal wall hernia. No pneumoperitoneum. Musculoskeletal: No acute or significant osseous findings. IMPRESSION: 1. Mildly dilated fluid-filled loop of small bowel with transition point in the central abdomen concerning for developing small bowel obstruction. 2. Moderate long segment small bowel thickening upstream of the mildly dilated loop, which may represent a nonspecific enteritis. 3. Diffuse gallbladder wall thickening with cholelithiasis. Findings raise suspicion for acute cholecystitis. Further evaluation with right upper quadrant ultrasound is recommended. 4. Moderate volume ascites throughout the abdomen. 5. Crescentic area of fluid density along the lateral aspect of the right hepatic lobe which may represent a loculated component of ascites versus subcapsular fluid. 6. Moderate size left-sided pleural effusion. 7. Large volume of stool within the rectum with mild diffuse rectal wall thickening. 8. Interval increase in size of a left adnexal  cystic lesion. Further evaluation is recommended with a nonemergent pelvic ultrasound. Electronically Signed: By: Davina Poke M.D. On: 08/26/2019 10:56   US Paracentesis  Result Date: 08/26/2019 INDICATION: Ascites. EXAM: ULTRASOUND GUIDED PARACENTESIS MEDICATIONS: None. COMPLICATIONS: None immediate. PROCEDURE: After obtaining informed consent the left abdomen was sterilely prepped and draped. Small amount of ascites was present with the largest pocket in the left upper abdomen. A 6 French catheter was advanced into this pocket under ultrasound guidance. There were no complications. Approximately 150 cc of blood-tinged fluid obtained. FINDINGS: A total of approximately 150 cc of blood-tinged fluid was removed. Samples were sent to the laboratory as requested by the clinical team. IMPRESSION: Successful ultrasound-guided paracentesis yielding 150 cc of peritoneal fluid. Electronically Signed   By: Marcello Moores  Register   On: 08/26/2019 15:45   US Abdomen Limited Ruq  Result Date: 08/26/2019 CLINICAL DATA:  Acute cholecystitis, acute abdominal pain, nausea, vomiting EXAM: ULTRASOUND ABDOMEN LIMITED RIGHT UPPER QUADRANT COMPARISON:  CT abdomen and pelvis 08/26/2019 FINDINGS: Gallbladder: Dependent echogenic nonshadowing material throughout the gallbladder, question sludge. Single linear echogenic focus is seen without shadowing, 10 mm length, uncertain etiology. Gallbladder wall upper normal thickness. Minimal pericholecystic fluid, patient with ascites by earlier CT. Possible gallstone at the lower gallbladder segment seen on the earlier CT exam is not definitely delineated. Common bile duct: Diameter: 3 mm, normal Liver: No definite intrahepatic abnormalities. Echogenicity normal. Portal vein is patent on color Doppler imaging with normal direction of blood flow towards the liver. Other: None. IMPRESSION: Significant sludge within gallbladder. No definite gallbladder wall thickening or sonographic Murphy sign  to suggest acute cholecystitis. Small gallstone seen at the lower gallbladder segment on the earlier CT exam is not sonographically demonstrated. No additional focal hepatic biliary abnormalities identified. Findings called to Dr. Anselm Jungling on 08/26/2019 at 1703 hours. Electronically Signed   By: Lavonia Dana M.D.   On: 08/26/2019 17:14    Assessment and plan- Patient is a 63 y.o. female with history of breast cancer, history of stage 1A endometrial cancer, BRCA mutation, primary progressive aphasia, presented to emergency room for evaluation of generalized weakness, abdominal pain, nausea vomiting constipation.  #CT images were independently reviewed by me and discussed with patient and husband at the bedside. Patient has new onset of ascites, pleural effusion, pelvic cystic lesion, small bowel obstruction, and omental nodularity concerning for peritoneal carcinomatosis. Status post diagnostic paracentesis.  Cytology pending.  Have discussed with pathology and prelim results is positive for malignant cells.  Awaiting official pathology. 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  Differential includes peritoneal carcinomatosis from peritoneal cancer, peritoneal metastasis from other solid tumor; Patient has very poor performance status and poor prognosis from primary progressive aphasia.  Patient's husband understands the poor prognosis, and less likelihood of patient be able to tolerate any chemotherapy treatments.  she has BRCA positive, she may have options of oral chemotherapy, however patient's poor performance status, inability to communicate still make her very poor candidate for any treatment.  Patient husband understands that patient may not be a candidate for any treatment.  He still desires further diagnostic work-up including PET scan, additional biopsy if paracentesis  cytology is not diagnostic to confirm diagnosis and and would like to talk about her options.    Thank you for allowing me to participate in the care of this patient.  Total face to face encounter time for this patient visit was 70 min. >50% of the time was  spent in counseling and coordination of care.    Earlie Server, MD, PhD Hematology Oncology [facility] Pager- [phone] 08/27/2019

## 2019-08-27 NOTE — Progress Notes (Signed)
Initial Nutrition Assessment  DOCUMENTATION CODES:   Not applicable  INTERVENTION:   RD will monitor for [place]  Recommend SLP evaluation prior to diet initiation.   If family wishes to proceed with full scope of treatment, pt may benefit from PEG tube placement.   NUTRITION DIAGNOSIS:   Inadequate oral intake related to acute illness as evidenced by NPO status.  GOAL:   Patient will meet greater than or equal to 90% of their needs  MONITOR:   Diet advancement, Labs, Weight trends, Skin, I & O's  REASON FOR ASSESSMENT:   Malnutrition Screening Tool    ASSESSMENT:   63 y.o. 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  Unable to speak with pt as pt in non-verbal at baseline. Per chart review, it appears that pt has progressive asphasia and has now started developing dysphagia over the past few months. Pt has been having difficulty with solid foods and husband has been feeding her more of a liquid diet. Pt is followed by home hospice. Family reports pt with decreased appetite and oral intake for 2 weeks r/t nausea, vomiting and abdominal pain. Per chart, pt has lost 26lbs(13%) over the past year and 12lbs(7%) over the past 6 months; this is not significant weight loss. Pt currently NPO. Palliative care consult pending. If family wishes to proceed with full scope of treatment; pt may benefit from PEG tube placement. Would recommend SLP evaluation prior to diet initiation.    Medications reviewed and include: heparin, NaCl @75ml /hr  Labs reviewed: Hgb 10.7(L), Hct 34.0(L)  Unable to complete Nutrition-Focused physical exam at this time.   Diet Order:   Diet Order            Diet NPO  time specified  Diet effective now             EDUCATION NEEDS:   Not appropriate for education at this time  Skin:  Skin Assessment: Reviewed RN Assessment  Last BM:  pta  Height:   Ht Readings from Last 1 Encounters:  08/26/19 5\' 6"  (1.676 m)    Weight:   Wt Readings from Last 1 Encounters:  08/26/19 77.1 kg    Ideal Body Weight:  59 kg  BMI:  Body mass index is 27.44 kg/m.  Estimated Nutritional Needs:   Kcal:  1700-1900kcal/day  Protein:  80-90g/day  Fluid:  >1.7L/day  Ann Distance MS, RD, LDN Pager #- [phone] Office#- [phone] After Hours Pager: [phone]

## 2019-08-27 NOTE — Clinical Social Work Note (Signed)
CSW acknowledges consult that patient is from home with hospice. [facility] hospital liaison, Flo Shanks, RN is already following. CSW will continue to follow as well.  Dayton Scrape, [place]

## 2019-08-28 LAB — HIV ANTIBODY (ROUTINE TESTING W REFLEX): HIV Screen 4th Generation wRfx: NONREACTIVE

## 2019-08-28 NOTE — Progress Notes (Signed)
Palliative: Ann Wright is resting quietly in bed.  She makes and somewhat keeps eye contact, but is non verbal.  Husband Ann Wright is at bedside.  We talk about oncology visit yesterday, and Ann Wright tells me that they are to have another meeting with oncology later today with son Ann Wright and daughter Ann Wright on the phone.    At this time, patient and family would like to persue further testing and to explore further treatments options as they are offered. We assist Ann Wright to the bathroom, Ann Wright states she needs to mover her bowels after suppository.  She is able to shuffle her feet, but Ann Wright has expert handling and care for her. No further questions or concerns.   Plan:  Continue FULL scope/full code.  Patient and family requesting further testing and treatment options as offered.   41 minutes Ann Axe, NP Palliative Medicine Team Team Phone # [phone] Greater than 50% of this time was spent counseling and coordinating care related to the above assessment and plan.

## 2019-08-28 NOTE — Progress Notes (Signed)
[place] [facility] [place] NAME: Ann Wright    MR#:  [id number]  DATE OF BIRTH:  [birth year]  SUBJECTIVE:  should awake and alert appears more comfortable. Husband in the room. Tolerating some diet.  REVIEW OF SYSTEMS:   Review of Systems  Unable to perform ROS: Mental acuity   Tolerating Diet: Tolerating PT:   DRUG ALLERGIES:  No Known Allergies  VITALS:  Blood pressure 123/76, pulse 90, temperature 98.1 F (36.7 C), temperature source Oral, resp. rate 18, height 5\' 6"  (1.676 m), weight 77.1 kg, SpO2 98 %.  PHYSICAL EXAMINATION:   Physical Exam limtied exam GENERAL:  63 y.o.-year-old patient lying in the bed with no acute distress.  EYES: Pupils equal, round, reactive to light and accommodation. No scleral icterus.  HEENT: Head atraumatic, normocephalic. Oropharynx and nasopharynx clear.  NECK:  Supple, no jugular venous distention. No thyroid enlargement, no tenderness.  LUNGS: Normal breath sounds bilaterally, no wheezing, rales, rhonchi. No use of accessory muscles of respiration.  CARDIOVASCULAR: S1, S2 normal. No murmurs, rubs, or gallops.  ABDOMEN: Soft, nontender, nondistended. Bowel sounds present. No organomegaly or mass.  EXTREMITIES: No cyanosis, clubbing or edema b/l.    NEUROLOGIC: Cranial nerves II through XII are intact. No focal Motor or sensory deficits b/l.   PSYCHIATRIC:  patient is alert and has chronic aphasia SKIN: No obvious rash, lesion, or ulcer.   LABORATORY PANEL:  CBC Recent Labs  Lab 08/27/19 0459  WBC 5.7  HGB 10.7*  HCT 34.0*  PLT 559*    Chemistries  Recent Labs  Lab 08/26/19 0912 08/27/19 0459  NA 137 137  K 4.1 3.9  CL 102 104  CO2 23 23  GLUCOSE 124* 89  BUN 6* 7*  CREATININE 0.80 0.66  CALCIUM 9.8 9.0  AST 17  --   ALT 10  --   ALKPHOS 122  --   BILITOT 0.5  --    Cardiac Enzymes No results for input(s): TROPONINI in the last 168 hours. RADIOLOGY:  US  Paracentesis  Result Date: 08/26/2019 INDICATION: Ascites. EXAM: ULTRASOUND GUIDED PARACENTESIS MEDICATIONS: None. COMPLICATIONS: None immediate. PROCEDURE: After obtaining informed consent the left abdomen was sterilely prepped and draped. Small amount of ascites was present with the largest pocket in the left upper abdomen. A 6 French catheter was advanced into this pocket under ultrasound guidance. There were no complications. Approximately 150 cc of blood-tinged fluid obtained. FINDINGS: A total of approximately 150 cc of blood-tinged fluid was removed. Samples were sent to the laboratory as requested by the clinical team. IMPRESSION: Successful ultrasound-guided paracentesis yielding 150 cc of peritoneal fluid. Electronically Signed   By: Marcello Moores  Register   On: 08/26/2019 15:45   US Abdomen Limited Ruq  Result Date: 08/26/2019 CLINICAL DATA:  Acute cholecystitis, acute abdominal pain, nausea, vomiting EXAM: ULTRASOUND ABDOMEN LIMITED RIGHT UPPER QUADRANT COMPARISON:  CT abdomen and pelvis 08/26/2019 FINDINGS: Gallbladder: Dependent echogenic nonshadowing material throughout the gallbladder, question sludge. Single linear echogenic focus is seen without shadowing, 10 mm length, uncertain etiology. Gallbladder wall upper normal thickness. Minimal pericholecystic fluid, patient with ascites by earlier CT. Possible gallstone at the lower gallbladder segment seen on the earlier CT exam is not definitely delineated. Common bile duct: Diameter: 3 mm, normal Liver: No definite intrahepatic abnormalities. Echogenicity normal. Portal vein is patent on color Doppler imaging with normal direction of blood flow towards the liver. Other: None. IMPRESSION: Significant sludge within gallbladder. No definite gallbladder wall  thickening or sonographic Murphy sign to suggest acute cholecystitis. Small gallstone seen at the lower gallbladder segment on the earlier CT exam is not sonographically demonstrated. No additional  focal hepatic biliary abnormalities identified. Findings called to Dr. Anselm Jungling on 08/26/2019 at 1703 hours. Electronically Signed   By: Lavonia Dana M.D.   On: 08/26/2019 17:14   ASSESSMENT AND PLAN:   63 y.o. female  with past medical history of history of breast and  endometrial cancer BL salpingo-oophorectomy 2017, Picks Disease, diverticulitis, primary progressive aphasia  admitted on 08/26/2019 with Abdominal pain,new development of ascites and adnexal cyst enlargement   *Abdominal pain, new development of ascites and adnexal cyst enlargement in size. With constipation and partial SBO -Patient had history of endometrial cancer and surgery. -Status post ultrasound-guided paracentesis with 150 mL blood retained fluid. -Cytology showing malignant cells--details still pending -oncology consultation with Dr. Tasia Catchings appreciated. 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 He understands patient has poor prognosis and currently functional status quite poor for any treatment. -Palliative care consultation appreciated--- pt is full code  *Progressive aphasia At baseline patient is on hospice at home for last few weeks because of her progressive aphasia, which has left her debilitated and now she is also feeling some difficulty swallowing food. -Been followed by hospice as outpatient for two weeks  *Constipation with partial small bowel obstruction -no vomiting. Will start patient on clear liquid diet -she has some findings of acute cholecystitis however her LFTs are normal. Seen by Dr. Lysle Pearl in the ER. No surgical plans noted -will give stool softener/enema *Chronic anxiety -I will resume Xanax and escitalopram  overall carries a very poor prognosis  Case discussed with Care Management/Social Worker. Management plans  discussed with the family and they are in agreement.  CODE STATUS: full  DVT Prophylaxis: Lovenox  TOTAL TIME TAKING CARE OF THIS PATIENT: 30 minutes.  >50% time spent on counselling and coordination of care  POSSIBLE D/C IN *few* DAYS, DEPENDING ON CLINICAL CONDITION.  Note: This dictation was prepared with Dragon dictation along with smaller phrase technology. Any transcriptional errors that result from this process are unintentional.  Fritzi Mandes M.D on 08/28/2019 at 3:00 PM  Between 7am to 6pm - Pager - [phone]  After 6pm go to [email or web address] - password [email or web address] [place]  Office  [phone]  CC: Primary care physician; Wenda Low, MDPatient ID: Ann Wright, female   DOB: 10-Oct-1956, 63 y.o.   MRN: [id number]

## 2019-08-28 NOTE — Progress Notes (Signed)
Hematology/Oncology Progress Note [facility] [phone] [phone]  Patient Care Team: Wenda Low, MD as PCP - General (Internal Medicine)   Name of the patient: Ann Wright  [id number]  [birth year]  Date of visit: 08/28/19   INTERVAL HISTORY-  Patient was seen and evaluated.  She lies in the bed comfortably.  Husband is at bedside.  She has passed a bowel movement able to tolerate some p.o.   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/19/2016   Primary progressive aphasia ([place]) 08/09/2015   Anxiety 05/08/2014   Acquired aphasia 05/08/2014   Dysarthria 05/08/2014   Balbuties 05/08/2014     Past Medical History:  Diagnosis Date   Anxiety    Cancer ([place]) 1987   Breast - no chemotherapy required   Complication of anesthesia    "I had a hard time of waking up."   Diverticulitis    x 2 episodes- none recent   Gait abnormality 02/06/2019   High cholesterol    Primary progressive aphasia ([place])    limited speech" does understand verbal commands" per spouse   Primary progressive aphasia [facility])      Past Surgical History:  Procedure Laterality Date   BREAST BIOPSY     BREAST LUMPECTOMY Right 1987   malignant   COLONOSCOPY     COLONOSCOPY WITH PROPOFOL N/A 09/12/2016   Procedure: COLONOSCOPY WITH PROPOFOL;  Surgeon: Garlan Fair, MD;  Location: [facility];  Service: Endoscopy;  Laterality: N/A;   DILITATION & CURRETTAGE/HYSTROSCOPY WITH  VERSAPOINT RESECTION N/A 01/03/2016   Procedure: DILATATION & CURETTAGE/HYSTEROSCOPY WITH VERSAPOINT RESECTION;  Surgeon: Princess Bruins, MD;  Location: [facility];  Service: Gynecology;  Laterality: N/A;    Social History   Socioeconomic History   Marital status: Married    Spouse name: michael   Number of children: 2   Years of education: 12   Highest education level: Not on file  Occupational History   Occupation: retired  Scientist, product/process development strain: Patient refused   Food insecurity    Worry: Patient refused    Inability: Patient refused   Transportation needs    Medical: No    Non-medical: No  Tobacco Use   Smoking status: Never Smoker   Smokeless tobacco: Never Used  Substance and Sexual Activity   Alcohol use: No    Comment: occasional    Drug use: No   Sexual activity: Not Currently  Lifestyle   Physical activity    Days per week: 0 days    Minutes per session: 0 min   Stress: Patient refused  Relationships   Social connections    Talks on phone: Patient refused    Gets together: Patient refused    Attends religious service: Patient refused    Active member of club or organization: Patient refused    Attends meetings of clubs or organizations: Patient refused    Relationship status: Patient refused   Intimate partner violence    Fear of current  or ex partner: Not on file    Emotionally abused: Not on file    Physically abused: Not on file    Forced sexual activity: Not on file  Other Topics Concern   Not on file  Social History Narrative   Lives at home w/ her hsuband   Patient drinks 3 cups of caffeine daily.   Patient is right handed.     Family History  Problem Relation Age of Onset   Heart attack Mother    Pneumonia Father    Cancer Sister        breast   Cancer Sister        breast   Cancer Daughter        breast     Current Facility-Administered Medications:    0.9 %  sodium chloride infusion, ,  Intravenous, Continuous, Vaughan Basta, MD, Last Rate: 75 mL/hr at 08/28/19 1500   ALPRAZolam (XANAX) tablet 0.5 mg, 0.5 mg, Oral, QHS, Fritzi Mandes, MD, 0.5 mg at 08/27/19 2121   bisacodyl (DULCOLAX) suppository 10 mg, 10 mg, Rectal, Daily PRN, Dove, Tasha A, NP, 10 mg at 08/28/19 1047   docusate sodium (COLACE) capsule 100 mg, 100 mg, Oral, BID PRN, Vaughan Basta, MD   escitalopram (LEXAPRO) tablet 20 mg, 20 mg, Oral, Daily, Fritzi Mandes, MD, 20 mg at 08/28/19 1046   heparin injection 5,000 Units, 5,000 Units, Subcutaneous, Q8H, Vaughan Basta, MD, 5,000 Units at 08/28/19 1500   Physical exam:  Vitals:   08/27/19 0514 08/27/19 2008 08/28/19 0713 08/28/19 1500  BP: (!) 145/84 (!) 142/78 123/76 (!) 148/88  Pulse: 83 92 90 97  Resp: _0 Temp: 98.6 F (37 C) 98.1 F (36.7 C) 98.1 F (36.7 C) 98.4 F (36.9 C)  TempSrc: Oral Oral Oral Oral  SpO2: 94% 96% 98% 94%  Weight:      Height:       Physical Exam  Constitutional: No distress.  HENT:  Head: Normocephalic and atraumatic.  Eyes: Pupils are equal, round, and reactive to light. No scleral icterus.  Cardiovascular: Normal rate.  Pulmonary/Chest: Effort normal.  Abdominal: She exhibits distension.  Neurological: She is alert.  [place] Coordination difficulties, apraxia  Skin: Skin is warm.  Psychiatric: Mood normal.    CMP Latest Ref Rng & Units 08/27/2019  Glucose 70 - 99 mg/dL 89  BUN 8 - 23 mg/dL 7(L)  Creatinine 0.44 - 1.00 mg/dL 0.66  Sodium 135 - 145 mmol/L 137  Potassium 3.5 - 5.1 mmol/L 3.9  Chloride 98 - 111 mmol/L 104  CO2 22 - 32 mmol/L 23  Calcium 8.9 - 10.3 mg/dL 9.0  Total Protein 6.5 - 8.1 g/dL -  Total Bilirubin 0.3 - 1.2 mg/dL -  Alkaline Phos 38 - 126 U/L -  AST 15 - 41 U/L -  ALT 0 - 44 U/L -   CBC Latest Ref Rng & Units 08/27/2019  WBC 4.0 - 10.5 K/uL 5.7  Hemoglobin 12.0 - 15.0 g/dL 10.7(L)  Hematocrit 36.0 - 46.0 % 34.0(L)  Platelets 150 - 400 K/uL 559(H)     RADIOGRAPHIC STUDIES: I have personally reviewed the radiological images as listed and agreed with the findings in the report. Ct Abdomen Pelvis W Contrast  Addendum Date: 08/26/2019   ADDENDUM REPORT: 08/26/2019 16:31 ADDENDUM: After continued review, omental nodularity and thickening of the peritoneal surfaces are noted. These findings raise the possibility of peritoneal carcinomatosis and correlation with cytology from recent paracentesis can be performed. Electronically  Signed   By: Davina Poke M.D.   On: 08/26/2019 16:31   Result Date: 08/26/2019 CLINICAL DATA:  Acute abdominal pain, nausea, vomiting EXAM: CT ABDOMEN AND PELVIS WITH CONTRAST TECHNIQUE: Multidetector CT imaging of the abdomen and pelvis was performed using the standard protocol following bolus administration of intravenous contrast. CONTRAST:  137m OMNIPAQUE IOHEXOL 300 MG/ML  SOLN COMPARISON:  CT pelvis 02/12/2018 FINDINGS: Lower chest: Moderate left-sided pleural effusion with associated compressive atelectasis. Trace right pleural effusion. Hepatobiliary: Crescentic fluid density collection along the lateral aspect the right hepatic lobe (series 2, image 20) may represent a loculated component of ascites versus subcapsular fluid. Liver otherwise unremarkable. Thickened irregular appearance of the gallbladder with small focus of hyperdensity dependently suggesting cholelithiasis. No intrahepatic biliary dilatation. Pancreas: Suboptimally evaluated secondary to adjacent fluid. Pancreatic parenchyma appears to homogeneously enhance. No pancreatic ductal dilatation. Spleen: Normal in size without focal abnormality. Adrenals/Urinary Tract: Unremarkable adrenal glands. Tiny nonobstructing right renal calculus. No hydronephrosis. Ureters and urinary bladder appear unremarkable. Stomach/Bowel: Large rectal stool ball with mild circumferential rectal wall thickening. Moderate long segment small bowel wall thickening. Mildly dilated  fluid-filled loop of small bowel within the lower abdomen measuring up to 3.0 cm (series 5, image 36) with transition point in the mid abdomen (series 5, image 30). Vascular/Lymphatic: Aortic atherosclerosis. No enlarged abdominal or pelvic lymph nodes. Reproductive: 6.7 x 4.7 cm cystic lesion within the left adnexa. Uterus is not seen, likely surgically absent. Other: Moderate volume ascites throughout the abdomen. No abdominal wall hernia. No pneumoperitoneum. Musculoskeletal: No acute or significant osseous findings. IMPRESSION: 1. Mildly dilated fluid-filled loop of small bowel with transition point in the central abdomen concerning for developing small bowel obstruction. 2. Moderate long segment small bowel thickening upstream of the mildly dilated loop, which may represent a nonspecific enteritis. 3. Diffuse gallbladder wall thickening with cholelithiasis. Findings raise suspicion for acute cholecystitis. Further evaluation with right upper quadrant ultrasound is recommended. 4. Moderate volume ascites throughout the abdomen. 5. Crescentic area of fluid density along the lateral aspect of the right hepatic lobe which may represent a loculated component of ascites versus subcapsular fluid. 6. Moderate size left-sided pleural effusion. 7. Large volume of stool within the rectum with mild diffuse rectal wall thickening. 8. Interval increase in size of a left adnexal cystic lesion. Further evaluation is recommended with a nonemergent pelvic ultrasound. Electronically Signed: By: NDavina PokeM.D. On: 08/26/2019 10:56   [place]acent[place]  Result Date: 08/26/2019 INDICATION: Ascites. EXAM: ULTRASOUND GUIDED PARACENTESIS MEDICATIONS: None. COMPLICATIONS: None immediate. PROCEDURE: After obtaining informed consent the left abdomen was sterilely prepped and draped. Small amount of ascites was present with the largest pocket in the left upper abdomen. A 6 French catheter was advanced into this pocket under  ultrasound guidance. There were no complications. Approximately 150 cc of blood-tinged fluid obtained. FINDINGS: A total of approximately 150 cc of blood-tinged fluid was removed. Samples were sent to the laboratory as requested by the clinical team. IMPRESSION: Successful ultrasound-guided paracentesis yielding 150 cc of peritoneal fluid. Electronically Signed   By: TMarcello Moores Register   On: 08/26/2019 15:45   [place]Abdomen Limited Ruq  Result Date: 08/26/2019 CLINICAL DATA:  Acute cholecystitis, acute abdominal pain, nausea, vomiting EXAM: ULTRASOUND ABDOMEN LIMITED RIGHT UPPER QUADRANT COMPARISON:  CT abdomen and pelvis 08/26/2019 FINDINGS: Gallbladder: Dependent echogenic nonshadowing material throughout the gallbladder, question sludge. Single linear echogenic focus is seen without shadowing, 10 mm length, uncertain etiology. Gallbladder wall upper normal thickness. Minimal pericholecystic  fluid, patient with ascites by earlier CT. Possible gallstone at the lower gallbladder segment seen on the earlier CT exam is not definitely delineated. Common bile duct: Diameter: 3 mm, normal Liver: No definite intrahepatic abnormalities. Echogenicity normal. Portal vein is patent on color Doppler imaging with normal direction of blood flow towards the liver. Other: None. IMPRESSION: Significant sludge within gallbladder. No definite gallbladder wall thickening or sonographic Murphy sign to suggest acute cholecystitis. Small gallstone seen at the lower gallbladder segment on the earlier CT exam is not sonographically demonstrated. No additional focal hepatic biliary abnormalities identified. Findings called to Dr. Anselm Jungling on 08/26/2019 at 1703 hours. Electronically Signed   By: Lavonia Dana M.D.   On: 08/26/2019 17:14    Assessment and plan-  Patient is a 63 y.o. female history of breast cancer, history of stage 1A endometrial cancer, BRCA mutation, primary progressive aphasia, presented to emergency room for evaluation of  generalized weakness, abdominal pain, nausea vomiting constipation.  #Clinically peritoneal carcinomatosis.  Awaiting official cytology results. Discussed with pathology Dr. Ronnald Ramp.  Prelim result there are not malignant cells. Husband wants to have virtual meeting with children for further discussion.  I explained to him that I be happy to have a virtual meeting with patient's husband and patient children after official cytology result comes back I would hold CT-guided biopsy at this point.  If cytology is not diagnostic or not able to narrow down cell origin, may consider outpatient CT-guided biopsy.  Discussed with husband and he agrees with the plan.  Small bowel obstruction, clinically improving.  Had a bowel movement and able to tolerate some p.o.   Thank you for allowing me to participate in the care of this patient.   Earlie Server, MD, PhD Hematology Oncology [facility] at [facility] Pager- [phone] 08/28/2019

## 2019-08-28 NOTE — Evaluation (Signed)
Clinical/Bedside Swallow Evaluation Patient Details  Name: Ann Wright MRN: [id number] Date of Birth: 05/25/1956  Today's Date: 08/28/2019 Time: SLP Start Time (ACUTE ONLY): 1145 SLP Stop Time (ACUTE ONLY): 1216 SLP Time Calculation (min) (ACUTE ONLY): 31 min  Past Medical History:  Past Medical History:  Diagnosis Date  . Anxiety   . Cancer ([place]) 1987   Breast - no chemotherapy required  . Complication of anesthesia    "I had a hard time of waking up."  . Diverticulitis    x 2 episodes- none recent  . Gait abnormality 02/06/2019  . High cholesterol   . 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 Primary progressive aphasia [facility])    Past Surgical History:  Past Surgical History:  Procedure Laterality Date  . BREAST BIOPSY    . BREAST LUMPECTOMY Right 1987   malignant  . COLONOSCOPY    . COLONOSCOPY WITH PROPOFOL N/A 09/12/2016   Procedure: COLONOSCOPY WITH PROPOFOL;  Surgeon: Garlan Fair, MD;  Location: [facility];  Service: Endoscopy;  Laterality: N/A;  . DILITATION & CURRETTAGE/HYSTROSCOPY WITH VERSAPOINT RESECTION N/A 01/03/2016   Procedure: DILATATION & CURETTAGE/HYSTEROSCOPY WITH VERSAPOINT RESECTION;  Surgeon: Princess Bruins, MD;  Location: [place] [facility];  Service: Gynecology;  Laterality: N/A;   HPI:  Per admitting H&P: Ann Wright  is a 63 y.o. 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  She also have problems with her balancing so she needs 24-hour supervision and assistance on getting up and walking with support he cannot coordinate well while eating so she need to be fed.  Last few weeks to months she has gradual decline and cannot swallow solid food properly so husband had now switched to more liquid in her diet.    Assessment / Plan / Recommendation Clinical Impression  This 63 y/o female with hx of primary progressive aphasia presents with moderate oral phase dysphagia. As SLP entered the room, pt's husband was attempting to feed her chicken broth, however pt vocalized her distaste with moaning loudly. Husband & nsg reported pt is  currently tolerating a clear liquid diet with no overt s/s aspiration and reported to tolerate meds whole with thin liquid when given 1 at a time.  Prior to admission, pt was at home with in home hospice care, husband reports pt has been unable to tolerate solids for approximately 3-4 weeks, reporting she either refuses them or gets so choked that she coughs violently almost losing her breath. So he has been feeding her mostly liquids at home, which she appears to tolerate well. Pt did not follow any 1 step verbal commands, presented with a very flat affect, was largely nonverbal, but agreeable to PO trials.  Oral phase c/b mild to mod decreased oral strength and coordination as seen in mild to mod A-P transit delay and oral prep difficulties with 1/2 tsp & tsp's puree. Pt appeared to masticate trials puree with min rotary movement to assist A-P transit. Pharyngeal phase c/b multiple swallows with larger trials puree (TSP). Vocal quality remained clear throughout evaluation. Pt appeared to tolerate smaller 1/2 tsp size trials puree better with no multiple swallows. Pt appeared to tolerate single sips thin liquid via straw with no apparent overt s/s aspiration. Pt independently took single small sips via straw when offered with  functional lip seal and A-P transit, did not require cues to limit size of sip. Rec. Dysphagia I diet (Puree) with thin liquids, feed pt 1/2 tsp's puree and offer single sips thin liquid via straw. Rec extra gravies & sauces on side for moisture and to assist A-P transit, give meds whole in puree if pt will accept, if not may offer single pills with small sip thin  liquid as long as pt continues to tolerate with no overt s/s aspiration. SLP to f/u with toleration of diet and offer trials of upgraded consistency as appropriate. Discussed results of evaluation with husband and nsg who stated understanding and agreement. SLP Visit Diagnosis: Dysphagia, unspecified (R13.10)    Aspiration Risk  Mild aspiration risk;Moderate aspiration risk;Risk for inadequate nutrition/hydration    Diet Recommendation Dysphagia 1 (Puree);Thin liquid   Liquid Administration via: Cup;Straw Medication Administration: Other (Comment)(whole in puree if pt will accept; or whole w/thin liquid w/ ) Supervision: Full supervision/cueing for compensatory strategies Compensations: Minimize environmental distractions;Slow rate;Small sips/bites Postural Changes: Seated upright at 90 degrees;Remain upright for at least 30 minutes after po intake    Other  Recommendations Oral Care Recommendations: Oral care QID   Follow up Recommendations Other (comment)(TBD)      Frequency and Duration min 2x/week  2 weeks       Prognosis Prognosis for Safe Diet Advancement: Fair Barriers to Reach Goals: Cognitive deficits;Language deficits;Severity of deficits;Behavior Barriers/Prognosis Comment: Severe primary progressive aphasia      Swallow Study   General Date of Onset: 08/28/19 HPI: Per admitting H&P: Ann Wright  is a 63 y.o. 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  She also have problems with her balancing so she needs 24-hour supervision and assistance on getting up and walking with support he cannot coordinate well while eating so she need to be fed.  Last few weeks to months she has gradual decline and cannot swallow solid food properly so husband had now switched to more liquid in her diet. Type  of Study: Bedside Swallow Evaluation Diet Prior to this Study: Dysphagia 1 (puree);Thin liquids Respiratory Status: Room air History of Recent Intubation: No Behavior/Cognition: Alert;Cooperative;Requires cueing;Doesn't follow directions;Other (Comment)(very flat affect) Oral Cavity Assessment: Within Functional Limits Oral Care Completed by SLP: Other (Comment)(Pt currently eating lunch) Oral Cavity - Dentition: Adequate natural dentition Self-Feeding Abilities: Total assist Patient Positioning: Upright in bed Baseline Vocal Quality: Normal Volitional Cough: Cognitively unable to elicit Volitional Swallow: Unable to elicit    Oral/Motor/Sensory Function Overall Oral Motor/Sensory Function: Generalized oral weakness Facial Symmetry: Within Functional Limits   Ice Chips Ice chips: Within functional limits Presentation: Spoon   Thin Liquid Thin Liquid: Within functional limits Presentation: Straw    Nectar Thick Nectar Thick Liquid: Not tested   Honey Thick Honey Thick Liquid: Not tested   Puree Puree: Impaired Presentation: Spoon Oral Phase Impairments: Reduced lingual movement/coordination;Other (comment)(appeared to "chew" puree w/no rotary movement to assist A-P ) Oral Phase Functional Implications: Prolonged oral transit Pharyngeal Phase Impairments: Multiple swallows;Other (comments)(multiple swallows with tsp size amounts, single w/1/2 TSP)   Solid     Solid: Not tested      Slate Debroux, MA, CCC-SLP 08/28/2019,2:42 PM

## 2019-08-29 ENCOUNTER — Inpatient Hospital Stay

## 2019-08-29 ENCOUNTER — Other Ambulatory Visit: Payer: Self-pay | Admitting: Oncology

## 2019-08-29 DIAGNOSIS — C763 Malignant neoplasm of pelvis: Secondary | ICD-10-CM

## 2019-08-29 LAB — CEA: CEA: 1.8 ng/mL (ref 0.0–4.7)

## 2019-08-29 LAB — CA 125: Cancer Antigen (CA) 125: 507 U/mL — ABNORMAL HIGH (ref 0.0–38.1)

## 2019-08-29 LAB — CYTOLOGY - NON PAP

## 2019-08-29 MED ORDER — VANCOMYCIN HCL 1.5 G IV SOLR
1500.0000 mg | Freq: Once | INTRAVENOUS | Status: AC
  Administered 2019-08-29: 1500 mg via INTRAVENOUS
  Filled 2019-08-29 (×2): qty 1500

## 2019-08-29 MED ORDER — DIATRIZOATE MEGLUMINE & SODIUM 66-10 % PO SOLN
90.0000 mL | Freq: Once | ORAL | Status: AC
  Administered 2019-08-29: 20:00:00 90 mL via ORAL

## 2019-08-29 MED ORDER — FUROSEMIDE 10 MG/ML IJ SOLN
20.0000 mg | Freq: Two times a day (BID) | INTRAMUSCULAR | Status: DC
  Administered 2019-08-29 – 2019-08-31 (×4): 20 mg via INTRAVENOUS
  Filled 2019-08-29 (×4): qty 4

## 2019-08-29 MED ORDER — VANCOMYCIN HCL IN DEXTROSE 750-5 MG/150ML-% IV SOLN
750.0000 mg | Freq: Two times a day (BID) | INTRAVENOUS | Status: DC
  Administered 2019-08-30 – 2019-08-31 (×3): 750 mg via INTRAVENOUS
  Filled 2019-08-29 (×4): qty 150

## 2019-08-29 MED ORDER — ADULT MULTIVITAMIN W/MINERALS CH
1.0000 | ORAL_TABLET | Freq: Every day | ORAL | Status: DC
  Filled 2019-08-29: qty 1

## 2019-08-29 MED ORDER — SODIUM CHLORIDE 0.9 % IV SOLN
2.0000 g | Freq: Three times a day (TID) | INTRAVENOUS | Status: DC
  Administered 2019-08-29 – 2019-08-31 (×6): 2 g via INTRAVENOUS
  Filled 2019-08-29 (×9): qty 2

## 2019-08-29 MED ORDER — ENSURE ENLIVE PO LIQD
237.0000 mL | Freq: Two times a day (BID) | ORAL | Status: DC
  Administered 2019-08-30: 11:00:00 237 mL via ORAL

## 2019-08-29 MED ORDER — PANTOPRAZOLE SODIUM 40 MG IV SOLR
40.0000 mg | INTRAVENOUS | Status: DC
  Administered 2019-08-29 – 2019-08-30 (×2): 40 mg via INTRAVENOUS
  Filled 2019-08-29 (×2): qty 40

## 2019-08-29 NOTE — Progress Notes (Signed)
Subjective:  CC: Ann Wright is a 63 y.o. female  Hospital stay day 3,   carcinomatosis  HPI: Asked by primary to talk to husband.  He has concerns she is intermittently having obstructions since she vomiting again earlier.  She has been BMs since admission.  ROS:  Unable to obtain secondary to PPA  Objective:   Temp:  [98.6 F (37 C)-99.8 F (37.7 C)] 99.8 F (37.7 C) (09/04 1203) Pulse Rate:  [83-89] 86 (09/04 1203) Resp:  [18-20] 20 (09/04 1203) BP: (120-136)/(72-92) 129/92 (09/04 1203) SpO2:  [93 %-96 %] 96 % (09/04 1203)     Height: 5\' 6"  (167.6 cm) Weight: 77.1 kg BMI (Calculated): 27.45   Intake/Output this shift:  No intake or output data in the 24 hours ending 08/29/19 1906  Constitutional :  alert, appears stated age and no distress  Respiratory:  clear to auscultation bilaterally  Cardiovascular:  regular rate and rhythm  Gastrointestinal: soft, non-tender; bowel sounds normal; no masses,  no organomegaly.   Skin: Cool and moist.   Psychiatric: Normal affect, non-agitated, not confused       LABS:  CMP Latest Ref Rng & Units 08/27/2019 08/26/2019 02/12/2018  Glucose 70 - 99 mg/dL 89 124(H) 202(H)  BUN 8 - 23 mg/dL 7(L) 6(L) 18  Creatinine 0.44 - 1.00 mg/dL 0.66 0.80 0.85  Sodium 135 - 145 mmol/L 137 137 137  Potassium 3.5 - 5.1 mmol/L 3.9 4.1 4.7  Chloride 98 - 111 mmol/L 104 102 104  CO2 22 - 32 mmol/L 23 23 23   Calcium 8.9 - 10.3 mg/dL 9.0 9.8 9.5  Total Protein 6.5 - 8.1 g/dL - 7.7 -  Total Bilirubin 0.3 - 1.2 mg/dL - 0.5 -  Alkaline Phos 38 - 126 U/L - 122 -  AST 15 - 41 U/L - 17 -  ALT 0 - 44 U/L - 10 -   CBC Latest Ref Rng & Units 08/27/2019 08/26/2019 02/12/2018  WBC 4.0 - 10.5 K/uL 5.7 7.9 12.3(H)  Hemoglobin 12.0 - 15.0 g/dL 10.7(L) 12.6 12.8  Hematocrit 36.0 - 46.0 % 34.0(L) 39.5 38.1  Platelets 150 - 400 K/uL 559(H) 705(H) 405    RADS: CLINICAL DATA:  Ileus.  EXAM: DG ABDOMEN ACUTE W/ 1V CHEST  COMPARISON:  None.  FINDINGS: Moderate  left pleural effusion is noted with associated left lower lobe atelectasis or inflammation. No pneumothorax is noted. Right lung is clear. No pneumoperitoneum is noted. No abnormal bowel dilatation is noted. Moderate amount of stool seen in the rectum. No abnormal calcifications are noted.  IMPRESSION: Moderate left pleural effusion is noted with associated left lower lobe atelectasis or infiltrate. No definite evidence of abnormal bowel dilatation. Moderate amount of stool seen in the rectum concerning for possible impaction.   Electronically Signed   By: Marijo Conception M.D.   On: 08/29/2019 13:12 Assessment:   Episode of emesis.  Will proceed with SBFT to see if we can find a transition point.  Issues is she is not a surgical candidate for repair.  If obstruction is found, husband is requesting a decompressive g tube.  I probably will recommend PEG tube with GI if possible since it is less invasive and likely to succeed rather than an open gtube placement through patient with malignant ascites.    If no obstruction is found, and she still continues to have emesis, recommend maximizing medical management or still consider PEG tube anyways.

## 2019-08-29 NOTE — Consult Note (Addendum)
Pharmacy Antibiotic Note  Ann Wright is a 63 y.o. female admitted on 08/26/2019 with HCAP.  Pharmacy has been consulted for vanc and cefepime dosing.  Per MD, concerns of HCAP w/ aspiration. CT showed moderate left pleural effusion with associated left lower lobe atelectasis or infiltrate.  Assessment/Plan: Pharmacy will begin to treat empirically for HCAP.  Cefepime 2g IV Q8H.    Will administer Vancomycin 1500 mg x 1 loading dose.  Follow with maintenance dosing of vancomycin 750 mg IV BID Q12H. The following is expected: AUC goal:  400 -550 AUC 453.4 Cssmin 13.7 T1/2 11.6  Pharmacy will continue to trend and monitor renal function.  Next Scr ordered in the AM 9/5.  MRSA PCR ordered 9/4.  Vancomycin levels will be monitored by pharmacy as clinically appropriate.    Height: 5\' 6"  (167.6 cm) Weight: 170 lb (77.1 kg) IBW/kg (Calculated) : 59.3   Temp (24hrs), Avg:98.9 F (37.2 C), Min:98.4 F (36.9 C), Max:99.8 F (37.7 C)  Recent Labs  Lab 08/26/19 0912 08/27/19 0459  WBC 7.9 5.7  CREATININE 0.80 0.66    Estimated Creatinine Clearance: 74.5 mL/min (by C-G formula based on SCr of 0.66 mg/dL).    No Known Allergies  Antimicrobials this admission: Cefepime 9/4 >> Vanc 9/4 >>  Microbiology results: 9/4 MRSA pending 9/1 Peritoneal fluid NG 9//1 Peritoneal fluid gram stain no organisms seen  Thank you for allowing pharmacy to be a part of this patient's care.  Gerald Dexter, PharmD Pharmacy Resident  08/29/2019 3:23 PM

## 2019-08-29 NOTE — Progress Notes (Signed)
[facility] [place] NAME: Ann Wright    MR#:  [id number]  DATE OF BIRTH:  17-Dec-1956  SUBJECTIVE:  should awake and alert, had one episode of vomiting today, RN is concerned that patient might be choking on her food as she started vomiting after she ate few bites of breakfast today, husband  Micky in the room.   REVIEW OF SYSTEMS:   Review of Systems  Unable to perform ROS: Mental acuity   Tolerating Diet: Tolerating PT:   DRUG ALLERGIES:  No Known Allergies  VITALS:  Blood pressure (!) 129/92, pulse 86, temperature 99.8 F (37.7 C), temperature source Oral, resp. rate 20, height 5\' 6"  (1.676 m), weight 77.1 kg, SpO2 96 %.  PHYSICAL EXAMINATION:   Physical Exam limtied exam GENERAL:  63 y.o.-year-old patient lying in the bed with no acute distress.  EYES: Pupils equal, round, reactive to light and accommodation. No scleral icterus.  HEENT: Head atraumatic, normocephalic. Oropharynx and nasopharynx clear.  NECK:  Supple, no jugular venous distention. No thyroid enlargement, no tenderness.  LUNGS: Normal breath sounds bilaterally, no wheezing, rales, rhonchi. No use of accessory muscles of respiration.  CARDIOVASCULAR: S1, S2 normal. No murmurs, rubs, or gallops.  ABDOMEN: Soft, nontender, nondistended. Bowel sounds present. No organomegaly or mass.  EXTREMITIES: No cyanosis, clubbing or edema b/l.    NEUROLOGIC: Cranial nerves II through XII are intact. No focal Motor or sensory deficits b/l.   PSYCHIATRIC:  patient is alert and has chronic aphasia SKIN: No obvious rash, lesion, or ulcer.   LABORATORY PANEL:  CBC Recent Labs  Lab 08/27/19 0459  WBC 5.7  HGB 10.7*  HCT 34.0*  PLT 559*    Chemistries  Recent Labs  Lab 08/26/19 0912 08/27/19 0459  NA 137 137  K 4.1 3.9  CL 102 104  CO2 23 23  GLUCOSE 124* 89  BUN 6* 7*  CREATININE 0.80 0.66  CALCIUM 9.8 9.0  AST 17  --   ALT 10  --   ALKPHOS 122  --    BILITOT 0.5  --    Cardiac Enzymes No results for input(s): TROPONINI in the last 168 hours. RADIOLOGY:  Dg Abd Acute 2+v W 1v Chest  Result Date: 08/29/2019 CLINICAL DATA:  Ileus. EXAM: DG ABDOMEN ACUTE W/ 1V CHEST COMPARISON:  None. FINDINGS: Moderate left pleural effusion is noted with associated left lower lobe atelectasis or inflammation. No pneumothorax is noted. Right lung is clear. No pneumoperitoneum is noted. No abnormal bowel dilatation is noted. Moderate amount of stool seen in the rectum. No abnormal calcifications are noted. IMPRESSION: Moderate left pleural effusion is noted with associated left lower lobe atelectasis or infiltrate. No definite evidence of abnormal bowel dilatation. Moderate amount of stool seen in the rectum concerning for possible impaction. Electronically Signed   By: Marijo Conception M.D.   On: 08/29/2019 13:12   ASSESSMENT AND PLAN:   63 y.o. female  with past medical history of history of breast and  endometrial cancer BL salpingo-oophorectomy 2017, Picks Disease, diverticulitis, primary progressive aphasia  admitted on 08/26/2019 with Abdominal pain,new development of ascites and adnexal cyst enlargement   *Healthcare associated pneumonia-concern for aspiration Patient is started on cefepime and vancomycin 3 with moderate pleural effusion we will give IV Lasix  *Abdominal pain, new development of ascites and adnexal cyst enlargement in size. With constipation and partial SBO -Patient had history of endometrial cancer and surgery. -Status post ultrasound-guided  paracentesis with 150 mL blood retained fluid. -Cytology showing malignant cells--details still pending -oncology consultation with Dr. Tasia Catchings appreciated. 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 He understands patient has poor  prognosis and currently functional status quite poor for any treatment. -Palliative care consultation appreciated--- pt is full code -Abdominal series with stool impaction, surgery Dr. Lysle Pearl is following  *Progressive aphasia At baseline patient is on hospice at home for last few weeks because of her progressive aphasia, which has left her debilitated and now she is also feeling some difficulty swallowing food. -Been followed by hospice as outpatient for two weeks  *Constipation with partial small bowel obstruction -Patient is on dysphagia diet -she has some findings of acute cholecystitis however her LFTs are normal. Seen by Dr. Lysle Pearl in the ER. No surgical plans noted - stool softener/enema.  Follow-up with surgery  *Chronic anxiety -I will resume Xanax and escitalopram  overall carries a very poor prognosis  Case discussed with Care Management/Social Worker. Management plans discussed with the husband [name] healthcare power of attorney and they are in agreement.  CODE STATUS: full  DVT Prophylaxis: Lovenox  TOTAL TIME TAKING CARE OF THIS PATIENT: 36 minutes.  >50% time spent on counselling and coordination of care  POSSIBLE D/C IN *few* DAYS, DEPENDING ON CLINICAL CONDITION.  Note: This dictation was prepared with Dragon dictation along with smaller phrase technology. Any transcriptional errors that result from this process are unintentional.  Nicholes Mango M.D on 08/29/2019 at 2:00 PM  Between 7am to 6pm - Pager - [phone]  After 6pm go to [email or web address] - password [email or web address] [facility]  Office  [phone]  CC: Primary care physician; Wenda Low, MDPatient ID: Ann Wright, female   DOB: 06/04/1956, 63 y.o.   MRN: [id number]

## 2019-08-29 NOTE — Progress Notes (Signed)
  Speech Language Pathology Treatment: Dysphagia  Patient Details Name: Ann Wright MRN: [id number] DOB: 1956-07-16 Today's Date: 08/29/2019 Time: [id number] SLP Time Calculation (min) (ACUTE ONLY): 34 min  Assessment / Plan / Recommendation Clinical Impression  Patient vomited this AM after PO intake and this afternoon her husband noted some changes in her swallowing with liquids.  Imaging this PM shows: "Moderate left pleural effusion is noted with associated left lower lobe atelectasis or infiltrate. No definite evidence of abnormal bowel dilatation. Moderate amount of stool seen in the rectum concerning for possible impaction."  The patient was observed with her husband giving her small sips of water and apple juice via straw and bites of vanilla ice cream via spoon.  She would not accept thickened liquid trial. She had approximately 10-15 small boluses with one episode of coughing.  The patient is non-verbal but does vocalize at times.  All vocalizations were produced with clear vocal quality.  Overall, she appears to be at minimal risk for prandial aspiration given small boluses.  Recommend continuing with her current diet.  If she refuses, stop feeding. If coughing increases, we will re-evaluate.  In the meantime, she is not likely to be able to meet her caloric or hydration needs by mouth.    HPI HPI: Per admitting H&P: Ann Wright  is a 64 y.o. 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  She also have problems with her balancing so she needs 24-hour supervision and assistance on getting up and walking with support he cannot coordinate well while eating so she need to be fed.  Last few weeks to months she has gradual decline and cannot swallow solid food properly so husband had now switched to more liquid in  her diet.      SLP Plan  Continue with current plan of care       Recommendations  Diet recommendations: Dysphagia 1 (puree);Thin liquid Medication Administration: Whole meds with liquid Compensations: Minimize environmental distractions;Slow rate;Small sips/bites                Oral Care Recommendations: Oral care QID;Staff/trained caregiver to provide oral care SLP Visit Diagnosis: Dysphagia, oropharyngeal phase (R13.12) Plan: Continue with current plan of care       GO               Ann Wright, [place], Daine Floras 08/29/2019, 4:20 PM

## 2019-08-29 NOTE — Progress Notes (Signed)
ST note: SLP spoke with the patient's husband to establish PO toleration of pureed solids.  The patient's husband reported that she vomited after eating.  He states that this has been going on for awhile.  Per his report, the patient is able to swallow food and drink but then will vomit.  He also report frequent nocturnal choking/coughing episodes at home but not while she has been in the hospital.  SLP will continue to follow for PO toleration.  Leroy Sea, MS/CCC- SLP

## 2019-08-30 ENCOUNTER — Inpatient Hospital Stay

## 2019-08-30 DIAGNOSIS — C763 Malignant neoplasm of pelvis: Secondary | ICD-10-CM

## 2019-08-30 LAB — CREATININE, SERUM
Creatinine, Ser: 0.58 mg/dL (ref 0.44–1.00)
GFR calc Af Amer: >60 mL/min (ref >60)
GFR calc non Af Amer: >60 mL/min (ref >60)

## 2019-08-30 LAB — MRSA PCR SCREENING: MRSA by PCR: NEGATIVE

## 2019-08-30 NOTE — Progress Notes (Signed)
[facility] NAME: Ann Wright    MR#:  [id number]  DATE OF BIRTH:  07-06-1956  SUBJECTIVE:  Pt is  awake and alert, no other episode of vomiting today, daughter is at bedside.  Seen by surgery and hematology oncology  REVIEW OF SYSTEMS:   Review of Systems  Unable to perform ROS: Mental acuity   Tolerating Diet: Tolerating PT:   DRUG ALLERGIES:  No Known Allergies  VITALS:  Blood pressure 140/63, pulse 87, temperature 98.4 F (36.9 C), temperature source Oral, resp. rate 20, height 5\' 6"  (1.676 m), weight 77.1 kg, SpO2 95 %.  PHYSICAL EXAMINATION:   Physical Exam limtied exam GENERAL:  63 y.o.-year-old patient lying in the bed with no acute distress.  EYES: Pupils equal, round, reactive to light and accommodation. No scleral icterus.  HEENT: Head atraumatic, normocephalic. Oropharynx and nasopharynx clear.  NECK:  Supple, no jugular venous distention. No thyroid enlargement, no tenderness.  LUNGS: Normal breath sounds bilaterally, no wheezing, rales, rhonchi. No use of accessory muscles of respiration.  CARDIOVASCULAR: S1, S2 normal. No murmurs, rubs, or gallops.  ABDOMEN: Soft, nontender, nondistended.  Hypoactive bowel sounds present.   EXTREMITIES: No cyanosis, clubbing or edema b/l.    NEUROLOGIC: Awake and alert but demented PSYCHIATRIC:  patient is alert and has chronic aphasia SKIN: No obvious rash, lesion, or ulcer.   LABORATORY PANEL:  CBC Recent Labs  Lab 08/27/19 0459  WBC 5.7  HGB 10.7*  HCT 34.0*  PLT 559*    Chemistries  Recent Labs  Lab 08/26/19 0912 08/27/19 0459 08/30/19 0453  NA 137 137  --   K 4.1 3.9  --   CL 102 104  --   CO2 23 23  --   GLUCOSE 124* 89  --   BUN 6* 7*  --   CREATININE 0.80 0.66 0.58  CALCIUM 9.8 9.0  --   AST 17  --   --   ALT 10  --   --   ALKPHOS 122  --   --   BILITOT 0.5  --   --    Cardiac Enzymes No results for input(s): TROPONINI in the  last 168 hours. RADIOLOGY:  Dg Abd Acute 2+v W 1v Chest  Result Date: 08/29/2019 CLINICAL DATA:  Ileus. EXAM: DG ABDOMEN ACUTE W/ 1V CHEST COMPARISON:  None. FINDINGS: Moderate left pleural effusion is noted with associated left lower lobe atelectasis or inflammation. No pneumothorax is noted. Right lung is clear. No pneumoperitoneum is noted. No abnormal bowel dilatation is noted. Moderate amount of stool seen in the rectum. No abnormal calcifications are noted. IMPRESSION: Moderate left pleural effusion is noted with associated left lower lobe atelectasis or infiltrate. No definite evidence of abnormal bowel dilatation. Moderate amount of stool seen in the rectum concerning for possible impaction. Electronically Signed   By: Marijo Conception M.D.   On: 08/29/2019 13:12   Dg Abd Portable 1v-small Bowel Obstruction Protocol-initial, 8 Hr Delay  Result Date: 08/30/2019 CLINICAL DATA:  Small bowel obstruction. The patient was given 90 mL of Gastrografin PO 8 hours ago. EXAM: PORTABLE ABDOMEN - 1 VIEW COMPARISON:  Two views of the abdomen 08/29/2019. FINDINGS: Oral contrast is now seen throughout the colon. Large stool ball in the rectum is noted. There are some gas-filled but nondistended loops of small bowel in the abdomen. No unexpected abdominal calcification or focal bony abnormality. IMPRESSION: Oral contrast is now seen throughout  the colon. Large stool ball in the rectum noted. Gas-filled but nondistended loops of small bowel. No evidence of obstruction. Electronically Signed   By: Inge Rise M.D.   On: 08/30/2019 08:49   ASSESSMENT AND PLAN:   63 y.o. female  with past medical history of history of breast and  endometrial cancer BL salpingo-oophorectomy 2017, Picks Disease, diverticulitis, primary progressive aphasia  admitted on 08/26/2019 with Abdominal pain,new development of ascites and adnexal cyst enlargement   *Healthcare associated pneumonia-concern for aspiration Patient is started on  cefepime and vancomycin 08/29/2019 will continue the same X-ray with moderate pleural effusion we will give IV Lasix  *Abdominal pain,  from peritoneal carcinomatosis with malignant ascites constipation and partial SBO -Patient had history of endometrial cancer and surgery -Status post ultrasound-guided paracentesis with 150 mL blood retained fluid. -Cytology showing malignant cells-- --CT abdomen and pelvis shows possible peritoneal carcinomatosis -patient's husband wants diagnosis established. He understands patient has poor prognosis and currently functional status quite poor for any treatment. -Oncology has discussed patient's husband at bedside and daughter over phone regarding the pathology results and diagnosis of serous carcinoma and patient being a poor candidate for chemotherapy and recommended comfort care with hospice -Recommend therapeutic paracentesis for symptomatic relief if needed -Palliative care consultation appreciated--- pt is full code as per palliative care discussion on 08/28/2019 -Abdominal series with stool impaction, surgery Dr. Lysle Pearl has seen the patient not recommending NG tube for decompression as it will be more aggressive on the patient.  Awaiting clinical improvement and no interventions needed at this time surgery signed off  *Progressive aphasia At baseline patient is on hospice at home for last few weeks because of her progressive aphasia, which has left her debilitated and now she is also feeling some difficulty swallowing food. -Been followed by hospice as outpatient for two weeks  *Constipation with partial small bowel obstruction -Patient is on dysphagia diet -she has some findings of acute cholecystitis however her LFTs are normal. Seen by Dr. Lysle Pearl in the ER. No surgical plans noted - stool softener/enema.  Follow-up with surgery  *Chronic anxiety -I will resume Xanax and escitalopram  overall carries a very poor prognosis-oncology recommending hospice  care at home  Case discussed with Care Management/Social Worker. Management plans discussed with the husband [name] healthcare power of attorney and they are in agreement.  CODE STATUS: full  DVT Prophylaxis: Lovenox  TOTAL TIME TAKING CARE OF THIS PATIENT: 36 minutes.  >50% time spent on counselling and coordination of care  POSSIBLE D/C IN *few* DAYS, DEPENDING ON CLINICAL CONDITION.  Note: This dictation was prepared with Dragon dictation along with smaller phrase technology. Any transcriptional errors that result from this process are unintentional.  Nicholes Mango M.D on 08/30/2019 at 2:01 PM  Between 7am to 6pm - Pager - [phone]  After 6pm go to [email or web address] - password [email or web address] [facility]  Office  [phone]  CC: Primary care physician; Wenda Low, MDPatient ID: Ann Wright, female   DOB: [birth year], 63 y.o.   MRN: [id number]

## 2019-08-30 NOTE — Progress Notes (Signed)
UPDATE: KUB shows contrast within the colon with the 8-hour delay films.  Minimal distention noted throughout the rest of the bowels per personal review of the images by myself.  With the recording of another bowel movement last night, I believe she does not have any signs of obstruction at this time.  There still is a chance she may intermittently get obstructed especially with the diagnosis of peritoneal carcinomatosis, but I believe moving forward with a decompressive PEG at this time may be a little too aggressive.    If the nausea/emesis cannot be controlled medically with medications and feeding modifications, and reconsideration for a PEG tube per the GI service will probably be the best option.  As noted in my last note an open gastrostomy tube placement will be associated with significant morbidity due to her current medical status.  Recommendation is to advance diet as tolerated per dietitian and speech therapy recs to maximize nutritional intake with appropriate precautions taken to prevent aspiration issues.  Surgery will sign off at this point.  Please call with any additional questions or concerns

## 2019-08-30 NOTE — Progress Notes (Addendum)
Hematology/Oncology Progress Note [facility] Telephone:([phone] [phone]  Patient Care Team: Wenda Low, MD as PCP - General (Internal Medicine)   Name of the patient: Ann Wright  [id number]  12-31-1955  Date of visit: 08/30/19   INTERVAL HISTORY-  Patient was seen and evaluated.  Husband is at bedside. Patient had one episode of vomiting earlier today.  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   Primary progressive aphasia ([place]) 08/09/2015   Anxiety 05/08/2014   Acquired aphasia 05/08/2014   Dysarthria 05/08/2014   Balbuties 05/08/2014     Past Medical History:  Diagnosis Date   Anxiety    Cancer ([place]) 1987   Breast - no chemotherapy required   Complication of anesthesia    "I had a hard time of waking up."   Diverticulitis    x 2 episodes- none recent   Gait abnormality 02/06/2019   High cholesterol    Primary progressive aphasia ([place])    limited speech" does understand verbal commands" per spouse   Primary progressive aphasia [facility])      Past Surgical History:  Procedure Laterality Date   BREAST BIOPSY     BREAST LUMPECTOMY Right 1987   malignant   COLONOSCOPY     COLONOSCOPY WITH PROPOFOL N/A 09/12/2016   Procedure: COLONOSCOPY WITH PROPOFOL;  Surgeon: Garlan Fair, MD;  Location: [facility];  Service: Endoscopy;  Laterality: N/A;   DILITATION & CURRETTAGE/HYSTROSCOPY WITH VERSAPOINT RESECTION N/A 01/03/2016   Procedure:  DILATATION & CURETTAGE/HYSTEROSCOPY WITH VERSAPOINT RESECTION;  Surgeon: Princess Bruins, MD;  Location: [facility];  Service: Gynecology;  Laterality: N/A;    Social History   Socioeconomic History   Marital status: Married    Spouse name: michael   Number of children: 2   Years of education: 12   Highest education level: Not on file  Occupational History   Occupation: retired  Scientist, product/process development strain: Patient refused   Food insecurity    Worry: Patient refused    Inability: Patient refused   Transportation needs    Medical: No    Non-medical: No  Tobacco Use   Smoking status: Never Smoker   Smokeless tobacco: Never Used  Substance and Sexual Activity   Alcohol use: No    Comment: occasional    Drug use: No   Sexual activity: Not Currently  Lifestyle   Physical activity    Days per week: 0 days    Minutes per session: 0 min   Stress: Patient refused  Relationships   Social connections    Talks on phone: Patient refused    Gets together: Patient refused    Attends religious service: Patient refused    Active member of club or organization: Patient refused    Attends meetings of clubs or organizations: Patient refused    Relationship status: Patient refused   Intimate partner violence    Fear of current or ex partner: Not on file    Emotionally abused: Not  on file    Physically abused: Not on file    Forced sexual activity: Not on file  Other Topics Concern   Not on file  Social History Narrative   Lives at home w/ her hsuband   Patient drinks 3 cups of caffeine daily.   Patient is right handed.     Family History  Problem Relation Age of Onset   Heart attack Mother    Pneumonia Father    Cancer Sister        breast   Cancer Sister        breast   Cancer Daughter        breast     Current Facility-Administered Medications:    0.9 %  sodium chloride infusion, , Intravenous, Continuous, Vaughan Basta,  MD, Last Rate: 75 mL/hr at 08/30/19 1771   ALPRAZolam Duanne Moron) tablet 0.5 mg, 0.5 mg, Oral, QHS, Fritzi Mandes, MD, 0.5 mg at 08/29/19 2133   bisacodyl (DULCOLAX) suppository 10 mg, 10 mg, Rectal, Daily PRN, Dove, Tasha A, NP, 10 mg at 08/28/19 1047   ceFEPIme (MAXIPIME) 2 g in sodium chloride 0.9 % 100 mL IVPB, 2 g, Intravenous, Q8H, Gerald Dexter, RPH, Last Rate: 200 mL/hr at 08/30/19 0620, 2 g at 08/30/19 0620   docusate sodium (COLACE) capsule 100 mg, 100 mg, Oral, BID PRN, Vaughan Basta, MD   escitalopram (LEXAPRO) tablet 20 mg, 20 mg, Oral, Daily, Fritzi Mandes, MD, 20 mg at 08/29/19 1055   feeding supplement (ENSURE ENLIVE) (ENSURE ENLIVE) liquid 237 mL, 237 mL, Oral, BID BM, Gouru, Aruna, MD   furosemide (LASIX) injection 20 mg, 20 mg, Intravenous, Q12H, Gouru, Aruna, MD, 20 mg at 08/30/19 0624   heparin injection 5,000 Units, 5,000 Units, Subcutaneous, Q8H, Vaughan Basta, MD, 5,000 Units at 08/30/19 1657   multivitamin with minerals tablet 1 tablet, 1 tablet, Oral, Daily, Gouru, Aruna, MD   pantoprazole (PROTONIX) injection 40 mg, 40 mg, Intravenous, Q24H, Gouru, Aruna, MD, 40 mg at 08/29/19 1532   vancomycin (VANCOCIN) IVPB 750 mg/150 ml premix, 750 mg, Intravenous, Q12H, Gerald Dexter, [facility], Last Rate: 150 mL/hr at 08/30/19 0623, 750 mg at 08/30/19 0623   Physical exam:  Vitals:   08/29/19 1203 08/29/19 2028 08/29/19 2200 08/30/19 0456  BP: (!) 129/92 (!) 152/95 140/85 128/80  Pulse: 86 97  89  Resp: 20     Temp: 99.8 F (37.7 C) 98.9 F (37.2 C)  98.2 F (36.8 C)  TempSrc: Oral Oral  Oral  SpO2: 96% 97%  94%  Weight:      Height:       Physical Exam  Constitutional: No distress.  HENT:  Head: Normocephalic and atraumatic.  Eyes: Pupils are equal, round, and reactive to light. No scleral icterus.  Cardiovascular: Normal rate.  Pulmonary/Chest: Effort normal.  Abdominal: She exhibits distension.  Neurological: She is alert.   [place] Coordination difficulties, apraxia  Skin: Skin is warm.  Psychiatric: Mood normal.    CMP Latest Ref Rng & Units 08/30/2019  Glucose 70 - 99 mg/dL -  BUN 8 - 23 mg/dL -  Creatinine 0.44 - 1.00 mg/dL 0.58  Sodium 135 - 145 mmol/L -  Potassium 3.5 - 5.1 mmol/L -  Chloride 98 - 111 mmol/L -  CO2 22 - 32 mmol/L -  Calcium 8.9 - 10.3 mg/dL -  Total Protein 6.5 - 8.1 g/dL -  Total Bilirubin 0.3 - 1.2 mg/dL -  Alkaline Phos 38 - 126 U/L -  AST  15 - 41 U/L -  ALT 0 - 44 U/L -   CBC Latest Ref Rng & Units 08/27/2019  WBC 4.0 - 10.5 K/uL 5.7  Hemoglobin 12.0 - 15.0 g/dL 10.7(L)  Hematocrit 36.0 - 46.0 % 34.0(L)  Platelets 150 - 400 K/uL 559(H)    RADIOGRAPHIC STUDIES: I have personally reviewed the radiological images as listed and agreed with the findings in the report. Ct Abdomen Pelvis W Contrast  Addendum Date: 08/26/2019   ADDENDUM REPORT: 08/26/2019 16:31 ADDENDUM: After continued review, omental nodularity and thickening of the peritoneal surfaces are noted. These findings raise the possibility of peritoneal carcinomatosis and correlation with cytology from recent paracentesis can be performed. Electronically Signed   By: Davina Poke M.D.   On: 08/26/2019 16:31   Result Date: 08/26/2019 CLINICAL DATA:  Acute abdominal pain, nausea, vomiting EXAM: CT ABDOMEN AND PELVIS WITH CONTRAST TECHNIQUE: Multidetector CT imaging of the abdomen and pelvis was performed using the standard protocol following bolus administration of intravenous contrast. CONTRAST:  145m OMNIPAQUE IOHEXOL 300 MG/ML  SOLN COMPARISON:  CT pelvis 02/12/2018 FINDINGS: Lower chest: Moderate left-sided pleural effusion with associated compressive atelectasis. Trace right pleural effusion. Hepatobiliary: Crescentic fluid density collection along the lateral aspect the right hepatic lobe (series 2, image 20) may represent a loculated component of ascites versus subcapsular fluid. Liver otherwise unremarkable. Thickened  irregular appearance of the gallbladder with small focus of hyperdensity dependently suggesting cholelithiasis. No intrahepatic biliary dilatation. Pancreas: Suboptimally evaluated secondary to adjacent fluid. Pancreatic parenchyma appears to homogeneously enhance. No pancreatic ductal dilatation. Spleen: Normal in size without focal abnormality. Adrenals/Urinary Tract: Unremarkable adrenal glands. Tiny nonobstructing right renal calculus. No hydronephrosis. Ureters and urinary bladder appear unremarkable. Stomach/Bowel: Large rectal stool ball with mild circumferential rectal wall thickening. Moderate long segment small bowel wall thickening. Mildly dilated fluid-filled loop of small bowel within the lower abdomen measuring up to 3.0 cm (series 5, image 36) with transition point in the mid abdomen (series 5, image 30). Vascular/Lymphatic: Aortic atherosclerosis. No enlarged abdominal or pelvic lymph nodes. Reproductive: 6.7 x 4.7 cm cystic lesion within the left adnexa. Uterus is not seen, likely surgically absent. Other: Moderate volume ascites throughout the abdomen. No abdominal wall hernia. No pneumoperitoneum. Musculoskeletal: No acute or significant osseous findings. IMPRESSION: 1. Mildly dilated fluid-filled loop of small bowel with transition point in the central abdomen concerning for developing small bowel obstruction. 2. Moderate long segment small bowel thickening upstream of the mildly dilated loop, which may represent a nonspecific enteritis. 3. Diffuse gallbladder wall thickening with cholelithiasis. Findings raise suspicion for acute cholecystitis. Further evaluation with right upper quadrant ultrasound is recommended. 4. Moderate volume ascites throughout the abdomen. 5. Crescentic area of fluid density along the lateral aspect of the right hepatic lobe which may represent a loculated component of ascites versus subcapsular fluid. 6. Moderate size left-sided pleural effusion. 7. Large volume of  stool within the rectum with mild diffuse rectal wall thickening. 8. Interval increase in size of a left adnexal cystic lesion. Further evaluation is recommended with a nonemergent pelvic ultrasound. Electronically Signed: By: NDavina PokeM.D. On: 08/26/2019 10:56   UKorea[place]acentesis  Result Date: 08/26/2019 INDICATION: Ascites. EXAM: ULTRASOUND GUIDED PARACENTESIS MEDICATIONS: None. COMPLICATIONS: None immediate. PROCEDURE: After obtaining informed consent the left abdomen was sterilely prepped and draped. Small amount of ascites was present with the largest pocket in the left upper abdomen. A 6 French catheter was advanced into this pocket under ultrasound guidance. There were no complications.  Approximately 150 cc of blood-tinged fluid obtained. FINDINGS: A total of approximately 150 cc of blood-tinged fluid was removed. Samples were sent to the laboratory as requested by the clinical team. IMPRESSION: Successful ultrasound-guided paracentesis yielding 150 cc of peritoneal fluid. Electronically Signed   By: Marcello Moores  Register   On: 08/26/2019 15:45   Dg Abd Acute 2+v W 1v Chest  Result Date: 08/29/2019 CLINICAL DATA:  Ileus. EXAM: DG ABDOMEN ACUTE W/ 1V CHEST COMPARISON:  None. FINDINGS: Moderate left pleural effusion is noted with associated left lower lobe atelectasis or inflammation. No pneumothorax is noted. Right lung is clear. No pneumoperitoneum is noted. No abnormal bowel dilatation is noted. Moderate amount of stool seen in the rectum. No abnormal calcifications are noted. IMPRESSION: Moderate left pleural effusion is noted with associated left lower lobe atelectasis or infiltrate. No definite evidence of abnormal bowel dilatation. Moderate amount of stool seen in the rectum concerning for possible impaction. Electronically Signed   By: Marijo Conception M.D.   On: 08/29/2019 13:12   Dg Abd Portable 1v-small Bowel Obstruction Protocol-initial, 8 Hr Delay  Result Date: 08/30/2019 CLINICAL DATA:   Small bowel obstruction. The patient was given 90 mL of Gastrografin PO 8 hours ago. EXAM: PORTABLE ABDOMEN - 1 VIEW COMPARISON:  Two views of the abdomen 08/29/2019. FINDINGS: Oral contrast is now seen throughout the colon. Large stool ball in the rectum is noted. There are some gas-filled but nondistended loops of small bowel in the abdomen. No unexpected abdominal calcification or focal bony abnormality. IMPRESSION: Oral contrast is now seen throughout the colon. Large stool ball in the rectum noted. Gas-filled but nondistended loops of small bowel. No evidence of obstruction. Electronically Signed   By: Inge Rise M.D.   On: 08/30/2019 08:49   US Abdomen Limited Ruq  Result Date: 08/26/2019 CLINICAL DATA:  Acute cholecystitis, acute abdominal pain, nausea, vomiting EXAM: ULTRASOUND ABDOMEN LIMITED RIGHT UPPER QUADRANT COMPARISON:  CT abdomen and pelvis 08/26/2019 FINDINGS: Gallbladder: Dependent echogenic nonshadowing material throughout the gallbladder, question sludge. Single linear echogenic focus is seen without shadowing, 10 mm length, uncertain etiology. Gallbladder wall upper normal thickness. Minimal pericholecystic fluid, patient with ascites by earlier CT. Possible gallstone at the lower gallbladder segment seen on the earlier CT exam is not definitely delineated. Common bile duct: Diameter: 3 mm, normal Liver: No definite intrahepatic abnormalities. Echogenicity normal. Portal vein is patent on color Doppler imaging with normal direction of blood flow towards the liver. Other: None. IMPRESSION: Significant sludge within gallbladder. No definite gallbladder wall thickening or sonographic Murphy sign to suggest acute cholecystitis. Small gallstone seen at the lower gallbladder segment on the earlier CT exam is not sonographically demonstrated. No additional focal hepatic biliary abnormalities identified. Findings called to Dr. Anselm Jungling on 08/26/2019 at 1703 hours. Electronically Signed   By:  Lavonia Dana M.D.   On: 08/26/2019 17:14    Assessment and plan-  Patient is a 63 y.o. female history of breast cancer, history of stage 1A endometrial cancer, BRCA mutation, primary progressive aphasia previously on hospice, presented to emergency room for evaluation of generalized weakness, abdominal pain, nausea vomiting constipation.  # peritoneal carcinomatosis with malignant ascites. Peritoneal fluid cytology is positive for malignancy, favor serous carcinoma of gynecologic origin. CA125 is elevated at 500.7. Husband is at bedside and requests a discussion together with patient's daughters on the phone. Pathology results and diagnosis of serous carcinoma were reviewed with patient's family members. She is a poor candidate for chemotherapy- carboplatin taxol -  given her poor performance status, advanced neurological disease. Olarparib has common side effects including GI side effects and patient is unlikely to tolerate.  Recommend family to consider comfort care and hospice.  Palliative care has been consulted.   # Malignant ascites, recommend therapeutic paracentesis if patient becomes symptomatic.  # Emesis, ? SBO, surgery on board. Recommend trial of Reglan,   Thank you for allowing me to participate in the care of this patient.  I spent sufficient time to discuss many aspect of care, questions were answered to family's  satisfaction. Total face to face encounter time for this patient visit was 40 min. >50% of the time was  spent in counseling and coordination of care.   Earlie Server, MD, PhD Hematology Oncology [facility] Pager- [phone] 08/30/2019

## 2019-08-31 LAB — CULTURE, BODY FLUID W GRAM STAIN -BOTTLE: Culture: NO GROWTH

## 2019-08-31 MED ORDER — LORATADINE 10 MG PO TABS: 10.0000 mg | ORAL_TABLET | Freq: Every day | ORAL | Status: AC

## 2019-08-31 MED ORDER — MORPHINE SULFATE (CONCENTRATE) 10 MG/0.5ML PO SOLN: 2.5000 mg | ORAL | Status: DC | PRN

## 2019-08-31 MED ORDER — DOCUSATE SODIUM 100 MG PO CAPS: 100.0000 mg | ORAL_CAPSULE | Freq: Two times a day (BID) | ORAL | 0 refills | Status: AC | PRN

## 2019-08-31 MED ORDER — POLYETHYLENE GLYCOL 3350 17 G PO PACK: 17.0000 g | PACK | Freq: Every day | ORAL | Status: DC

## 2019-08-31 MED ORDER — CEFDINIR 300 MG PO CAPS
300.0000 mg | ORAL_CAPSULE | Freq: Two times a day (BID) | ORAL | Status: DC
  Administered 2019-08-31: 12:00:00 300 mg via ORAL
  Filled 2019-08-31 (×2): qty 1

## 2019-08-31 MED ORDER — MORPHINE SULFATE (CONCENTRATE) 10 MG/0.5ML PO SOLN: 2.5000 mg | ORAL | 0 refills | Status: AC | PRN

## 2019-08-31 MED ORDER — FUROSEMIDE 20 MG PO TABS: 20.0000 mg | ORAL_TABLET | Freq: Every day | ORAL | 0 refills | Status: AC

## 2019-08-31 MED ORDER — FUROSEMIDE 20 MG PO TABS
20.0000 mg | ORAL_TABLET | Freq: Every day | ORAL | Status: DC
  Administered 2019-08-31: 20 mg via ORAL
  Filled 2019-08-31: qty 1

## 2019-08-31 MED ORDER — PANTOPRAZOLE SODIUM 40 MG PO TBEC: 40.0000 mg | DELAYED_RELEASE_TABLET | Freq: Every day | ORAL | Status: DC

## 2019-08-31 MED ORDER — CEFDINIR 300 MG PO CAPS: 300.0000 mg | ORAL_CAPSULE | Freq: Two times a day (BID) | ORAL | 0 refills | Status: AC

## 2019-08-31 MED ORDER — MEGESTROL ACETATE 400 MG/10ML PO SUSP: 200.0000 mg | Freq: Two times a day (BID) | ORAL | 0 refills | Status: AC

## 2019-08-31 MED ORDER — PANTOPRAZOLE SODIUM 40 MG PO TBEC: 40.0000 mg | DELAYED_RELEASE_TABLET | Freq: Every day | ORAL | 0 refills | Status: AC

## 2019-08-31 MED ORDER — MEGESTROL ACETATE 400 MG/10ML PO SUSP
200.0000 mg | Freq: Two times a day (BID) | ORAL | Status: DC
  Administered 2019-08-31: 12:00:00 200 mg via ORAL
  Filled 2019-08-31 (×2): qty 10

## 2019-08-31 MED ORDER — ENSURE ENLIVE PO LIQD: 237.0000 mL | Freq: Two times a day (BID) | ORAL | 12 refills | Status: AC

## 2019-08-31 MED ORDER — POLYETHYLENE GLYCOL 3350 17 G PO PACK: 17.0000 g | PACK | Freq: Every day | ORAL | 0 refills | Status: AC

## 2019-08-31 MED ORDER — ADULT MULTIVITAMIN W/MINERALS CH: 1.0000 | ORAL_TABLET | Freq: Every day | ORAL | Status: AC

## 2019-08-31 MED ORDER — ALPRAZOLAM 0.5 MG PO TABS: 0.5000 mg | ORAL_TABLET | Freq: Every day | ORAL | 0 refills | Status: AC

## 2019-08-31 MED ORDER — BISACODYL 10 MG RE SUPP: 10.0000 mg | Freq: Every day | RECTAL | 0 refills | Status: AC | PRN

## 2019-08-31 NOTE — Discharge Instructions (Signed)
hospice care at home

## 2019-08-31 NOTE — Discharge Summary (Signed)
[place] [facility] [place] NAME: Ann Wright    MR#:  [id number]  DATE OF BIRTH:  03/22/1956  DATE OF ADMISSION:  08/26/2019 ADMITTING PHYSICIAN: Vaughan Basta, MD  DATE OF DISCHARGE: 08/31/19  PRIMARY CARE PHYSICIAN: Wenda Low, MD    ADMISSION DIAGNOSIS:  Acute cholecystitis [K81.0] SBO (small bowel obstruction) ([place]) [K56.609] Other ascites [R18.8] Ascites [R18.8] Constipation, unspecified constipation type [K59.00] Calculus of gallbladder with cholecystitis without biliary obstruction, unspecified cholecystitis acuity [K80.10]  DISCHARGE DIAGNOSIS:  Principal Problem:   SBO (small bowel obstruction) ([place]) Active Problems:   Small bowel obstruction (HCC)   Constipation   Goals of care, counseling/discussion   Palliative care by specialist   Other ascites   Peritoneal carcinomatosis ([place])   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 Anxiety   . Cancer ([place]) 1987   Breast - no chemotherapy required  . Complication of anesthesia    "I had a hard time of waking up."  . Diverticulitis    x 2 episodes- none recent  . Gait abnormality 02/06/2019  . High cholesterol   . 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 Primary progressive aphasia [facility])     HOSPITAL COURSE:  HISTORY OF PRESENT ILLNESS: Ann Wright  is a 63 y.o. 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  She also have problems with her balancing so she needs 24-hour supervision and assistance on getting up and walking with support he cannot coordinate well while eating so she need to be fed.  Last few weeks to months she has  gradual decline and cannot swallow solid food properly so husband had now switched to more liquid in her diet. For last few days she has not been eating much and was giving signs that she is hurting in her abdomen though she cannot talk but she can sign to husband.  He also felt there is some node in her abdomen, concerned with this he brought her to the emergency room.  CT scan abdomen was done which showed some adnexal cyst and some ascites with questionable partial small bowel obstruction and possibility of acute cholecystitis.  As per husband they gave her some suppository yesterday and she had a good bowel movement last night and she slept peacefully after that She is seen by surgical service who suggested as she had bowel movement they do not need to do any urgent surgery and there is no signs of adhesions.  They are not thinking that this is acute cholecystitis, and suggested she might need more work-up about her increasing cyst and ascites which could be cancer related, husband had agreed to pursue further diagnostic work-up so ER suggested to admit to medical services.   *Healthcare associated pneumonia-concern for aspiration Patient is started on cefepime and vancomycin 08/29/2019, clinically better will change to Omnicef p.o. to discharge.  Vancomycin discontinued as MRSA PCR negative X-ray with moderate pleural effusion ,IV Lasix continues to be on Lasix  *Abdominal pain, from peritoneal carcinomatosis with malignant ascites constipation and partial SBO -Patient had history of endometrial cancer and surgery -Status post ultrasound-guided paracentesis with 150 mL blood retained fluid. -Cytology showing malignant cells-- --CT abdomen and  pelvis shows possible peritoneal carcinomatosis -patient's husband wants diagnosis established. He understands patient has poor prognosis and currently functional status quite poor for any treatment. -Oncology has discussed patient's husband at bedside and  daughter over phone regarding the pathology results and diagnosis of serous carcinoma and patient being a poor candidate for chemotherapy and recommended comfort care with hospice -Recommend therapeutic paracentesis for symptomatic relief if needed -Palliative care consultation appreciated--- pt is full code as per palliative care discussion on 08/28/2019 -Abdominal series with stool impaction, surgery Dr. Lysle Pearl has seen the patient not recommending NG tube for decompression as it will be more aggressive on the patient.  Awaiting clinical improvement and no interventions needed at this time surgery signed off Morphine p.o. as needed  *Progressive aphasia At baseline patient is on hospice at home for last few weeks because of her progressive aphasia,which has left her debilitated and now she is also feeling some difficulty swallowing food. -Been followed by hospice as outpatient for two weeks  *Constipation with partial small bowel obstruction -Patient is on dysphagia diet -she has some findings of acute cholecystitis however her LFTs are normal. Seen by Dr. Lysle Pearl in the ER. No surgical plans noted - stool softener/enema.  MiraLAX is added to the regimen  *Chronic anxiety -I will resume Xanax and escitalopram  *Failure to thrive Megace for loss of appetite Home with hospice care.  Husband is agreeable.  They have a hospital bed at home Social worker will follow  overall carries a very poor prognosis-oncology recommending hospice care at home   DISCHARGE CONDITIONS:   guarded  CONSULTS OBTAINED:  Treatment Team:  Benjamine Sprague, Cipriano Mile, MD   PROCEDURES  None   DRUG ALLERGIES:  No Known Allergies  DISCHARGE MEDICATIONS:   Allergies as of 08/31/2019   No Known Allergies     Medication List    STOP taking these medications   trimethoprim 100 MG tablet Commonly known as: TRIMPEX     TAKE these medications   ALPRAZolam 0.5 MG tablet Commonly known as: XANAX Take  1 tablet (0.5 mg total) by mouth at bedtime.   bisacodyl 10 MG suppository Commonly known as: DULCOLAX Place 1 suppository (10 mg total) rectally daily as needed for mild constipation or moderate constipation.   cefdinir 300 MG capsule Commonly known as: OMNICEF Take 1 capsule (300 mg total) by mouth every 12 (twelve) hours.   docusate sodium 100 MG capsule Commonly known as: COLACE Take 1 capsule (100 mg total) by mouth 2 (two) times daily as needed for mild constipation.   escitalopram 5 MG/5ML solution Commonly known as: Lexapro Take 20 mLs (20 mg total) by mouth daily.   feeding supplement (ENSURE ENLIVE) Liqd Take 237 mLs by mouth 2 (two) times daily between meals.   furosemide 20 MG tablet Commonly known as: LASIX Take 1 tablet (20 mg total) by mouth daily. Start taking on: September 01, 2019   loratadine 10 MG tablet Commonly known as: Claritin Take 1 tablet (10 mg total) by mouth daily. What changed: how much to take   megestrol 400 MG/10ML suspension Commonly known as: MEGACE Take 5 mLs (200 mg total) by mouth 2 (two) times daily.   morphine CONCENTRATE 10 MG/0.5ML Soln concentrated solution Take 0.13 mLs (2.6 mg total) by mouth every 4 (four) hours as needed for severe pain.   multivitamin with minerals Tabs tablet Take 1 tablet by mouth daily. Start taking on: September 01, 2019   pantoprazole 40 MG tablet  Commonly known as: PROTONIX Take 1 tablet (40 mg total) by mouth daily.   polyethylene glycol 17 g packet Commonly known as: MIRALAX / GLYCOLAX Take 17 g by mouth daily.   TYLENOL EX ST ARTHRITIS PAIN PO Take 1 Dose by mouth as needed.        DISCHARGE INSTRUCTIONS:   Outpatient hospice care at home  DIET:  Regular diet  DISCHARGE CONDITION:  Fair  ACTIVITY:  Activity as tolerated  OXYGEN:  Home Oxygen: No.   Oxygen Delivery: room air  DISCHARGE LOCATION:  home   If you experience worsening of your admission symptoms, develop  shortness of breath, life threatening emergency, suicidal or homicidal thoughts you must seek medical attention immediately by calling 911 or calling your MD immediately  if symptoms less severe.  You Must read complete instructions/literature along with all the possible adverse reactions/side effects for all the Medicines you take and that have been prescribed to you. Take any new Medicines after you have completely understood and accpet all the possible adverse reactions/side effects.   Please note  You were cared for by a hospitalist during your hospital stay. If you have any questions about your discharge medications or the care you received while you were in the hospital after you are discharged, you can call the unit and asked to speak with the hospitalist on call if the hospitalist that took care of you is not available. Once you are discharged, your primary care physician will handle any further medical issues. Please note that NO REFILLS for any discharge medications will be authorized once you are discharged, as it is imperative that you return to your primary care physician (or establish a relationship with a primary care physician if you do not have one) for your aftercare needs so that they can reassess your need for medications and monitor your lab values.     Today  Chief Complaint  Patient presents with  . Constipation  . Abdominal Pain   Patient is resting fine.  No other episodes of vomiting.  Tolerating food Husband is agreeable with plan of hospice care Take her home today  ROS:  Patient has chronic dementia  VITAL SIGNS:  Blood pressure 111/76, pulse 94, temperature (!) 97.4 F (36.3 C), temperature source Oral, resp. rate 17, height 5\' 6"  (1.676 m), weight 77.1 kg, SpO2 97 %.  I/O:    Intake/Output Summary (Last 24 hours) at 08/31/2019 1221 Last data filed at 08/31/2019 0452 Gross per 24 hour  Intake 1485.81 ml  Output 0 ml  Net 1485.81 ml    PHYSICAL  EXAMINATION:  GENERAL:  63 y.o.-year-old patient lying in the bed with no acute distress.  EYES: Pupils equal, round, reactive to light and accommodation. No scleral icterus. Extraocular muscles intact.  HEENT: Head atraumatic, normocephalic. Oropharynx and nasopharynx clear.  NECK:  Supple, no jugular venous distention. No thyroid enlargement, no tenderness.  LUNGS: Normal breath sounds bilaterally, no wheezing, rales,rhonchi or crepitation. No use of accessory muscles of respiration.  CARDIOVASCULAR: S1, S2 normal. No murmurs, rubs, or gallops.  ABDOMEN: Soft, minimal tenderness is present no rebound tenderness, non-distended. Bowel sounds present.  EXTREMITIES: No pedal edema, cyanosis, or clubbing.  NEUROLOGIC: Cranial nerves II through XII are intact. Muscle strength 5/5 in all extremities. Sensation intact. Gait not checked.  PSYCHIATRIC: The patient is alert and disoriented from dementia SKIN: No obvious rash, lesion, or ulcer.   DATA REVIEW:   CBC Recent Labs  Lab 08/27/19 0459  WBC 5.7  HGB 10.7*  HCT 34.0*  PLT 559*    Chemistries  Recent Labs  Lab 08/26/19 0912 08/27/19 0459 08/30/19 0453  NA 137 137  --   K 4.1 3.9  --   CL 102 104  --   CO2 23 23  --   GLUCOSE 124* 89  --   BUN 6* 7*  --   CREATININE 0.80 0.66 0.58  CALCIUM 9.8 9.0  --   AST 17  --   --   ALT 10  --   --   ALKPHOS 122  --   --   BILITOT 0.5  --   --     Cardiac Enzymes No results for input(s): TROPONINI in the last 168 hours.  Microbiology Results  Results for orders placed or performed during the hospital encounter of 08/26/19  SARS Coronavirus 2 [facility] order, Performed in [facility] hospital lab) Nasopharyngeal Nasopharyngeal Swab     Status: None   Collection Time: 08/26/19 11:27 AM   Specimen: Nasopharyngeal Swab  Result Value Ref Range Status   SARS Coronavirus 2 NEGATIVE NEGATIVE Final    Comment: (NOTE) If result is NEGATIVE SARS-CoV-2 target nucleic acids are NOT  DETECTED. The SARS-CoV-2 RNA is generally detectable in upper and lower  respiratory specimens during the acute phase of infection. The lowest  concentration of SARS-CoV-2 viral copies this assay can detect is 250  copies / mL. A negative result does not preclude SARS-CoV-2 infection  and should not be used as the sole basis for treatment or other  patient management decisions.  A negative result may occur with  improper specimen collection / handling, submission of specimen other  than nasopharyngeal swab, presence of viral mutation(s) within the  areas targeted by this assay, and inadequate number of viral copies  (<250 copies / mL). A negative result must be combined with clinical  observations, patient history, and epidemiological information. If result is POSITIVE SARS-CoV-2 target nucleic acids are DETECTED. The SARS-CoV-2 RNA is generally detectable in upper and lower  respiratory specimens dur ing the acute phase of infection.  Positive  results are indicative of active infection with SARS-CoV-2.  Clinical  correlation with patient history and other diagnostic information is  necessary to determine patient infection status.  Positive results do  not rule out bacterial infection or co-infection with other viruses. If result is PRESUMPTIVE POSTIVE SARS-CoV-2 nucleic acids MAY BE PRESENT.   A presumptive positive result was obtained on the submitted specimen  and confirmed on repeat testing.  While 2019 novel coronavirus  (SARS-CoV-2) nucleic acids may be present in the submitted sample  additional confirmatory testing may be necessary for epidemiological  and / or clinical management purposes  to differentiate between  SARS-CoV-2 and other Sarbecovirus currently known to infect humans.  If clinically indicated additional testing with an alternate test  methodology [phone]) is advised. The SARS-CoV-2 RNA is generally  detectable in upper and lower respiratory sp ecimens during  the acute  phase of infection. The expected result is Negative. Fact Sheet for Patients:  StrictlyIdeas.no Fact Sheet for Healthcare Providers: BankingDealers.co.za This test is not yet approved or cleared by the [place] FDA and has been authorized for detection and/or diagnosis of SARS-CoV-2 by FDA under an Emergency Use Authorization (EUA).  This EUA will remain in effect (meaning this test can be used) for the duration of the COVID-19 declaration under Section 564(b)(1) of the Act, 21 U.S.C. section 360bbb-3(b)(1), unless the  authorization is terminated or revoked sooner. Performed at [facility], [address] [place], [place] [address]   Culture, body fluid-bottle     Status: None   Collection Time: 08/26/19  3:05 PM   Specimen: Fluid  Result Value Ref Range Status   Specimen Description FLUID PERITONEAL  Final   Special Requests BOTTLES DRAWN AEROBIC AND ANAEROBIC  Final   Culture   Final    NO GROWTH 5 DAYS Performed at [facility], [place] [address] [place], [place] [address]    Report Status 08/31/2019 FINAL  Final  Gram stain     Status: None   Collection Time: 08/26/19  3:05 PM   Specimen: Fluid  Result Value Ref Range Status   Specimen Description FLUID PERITONEAL  Final   Special Requests NONE  Final   Gram Stain   Final    FEW WBC PRESENT, PREDOMINANTLY MONONUCLEAR NO ORGANISMS SEEN Performed at [facility], [place] [address] [place], [place] [address]    Report Status 08/27/2019 FINAL  Final  MRSA PCR Screening     Status: None   Collection Time: 08/30/19  4:11 PM   Specimen: Nasal Mucosa; Nasopharyngeal  Result Value Ref Range Status   MRSA by PCR NEGATIVE NEGATIVE Final    Comment:        The GeneXpert MRSA Assay (FDA approved for NASAL specimens only), is one component of a comprehensive MRSA colonization surveillance program. It is not intended to diagnose MRSA infection  nor to guide or monitor treatment for MRSA infections. Performed at [facility], [address] [place], [place] [address]     RADIOLOGY:  Bea Graff Acute 2+v W 1v Chest  Result Date: 08/29/2019 CLINICAL DATA:  Ileus. EXAM: DG ABDOMEN ACUTE W/ 1V CHEST COMPARISON:  None. FINDINGS: Moderate left pleural effusion is noted with associated left lower lobe atelectasis or inflammation. No pneumothorax is noted. Right lung is clear. No pneumoperitoneum is noted. No abnormal bowel dilatation is noted. Moderate amount of stool seen in the rectum. No abnormal calcifications are noted. IMPRESSION: Moderate left pleural effusion is noted with associated left lower lobe atelectasis or infiltrate. No definite evidence of abnormal bowel dilatation. Moderate amount of stool seen in the rectum concerning for possible impaction. Electronically Signed   By: Marijo Conception M.D.   On: 08/29/2019 13:12   Dg Abd Portable 1v-small Bowel Obstruction Protocol-initial, 8 Hr Delay  Result Date: 08/30/2019 CLINICAL DATA:  Small bowel obstruction. The patient was given 90 mL of Gastrografin PO 8 hours ago. EXAM: PORTABLE ABDOMEN - 1 VIEW COMPARISON:  Two views of the abdomen 08/29/2019. FINDINGS: Oral contrast is now seen throughout the colon. Large stool ball in the rectum is noted. There are some gas-filled but nondistended loops of small bowel in the abdomen. No unexpected abdominal calcification or focal bony abnormality. IMPRESSION: Oral contrast is now seen throughout the colon. Large stool ball in the rectum noted. Gas-filled but nondistended loops of small bowel. No evidence of obstruction. Electronically Signed   By: Inge Rise M.D.   On: 08/30/2019 08:49    EKG:   Orders placed or performed during the hospital encounter of 08/26/19  . ED EKG  . ED EKG  . EKG      Management plans discussed with the patient, family and they are in agreement.  CODE STATUS:     Code Status Orders  (From  admission, onward)  Start     Ordered   08/26/19 1859  Full code  Continuous     08/26/19 1858        Code Status History    This patient has a current code status but no historical code status.   Advance Care Planning Activity    Advance Directive Documentation     Most Recent Value  Type of Advance Directive  Healthcare Power of Attorney  Pre-existing out of facility DNR order (yellow form or pink MOST form)  -  "MOST" Form in Place?  -      TOTAL TIME TAKING CARE OF THIS PATIENT: 45  minutes.   Note: This dictation was prepared with Dragon dictation along with smaller phrase technology. Any transcriptional errors that result from this process are unintentional.   @[facility] @  on 08/31/2019 at 12:21 PM  Between 7am to 6pm - Pager - [phone]  After 6pm go to [email or web address] - password [email or web address] [facility]  [place] [facility]  Office  [phone]  CC: Primary care physician; Wenda Low, MD

## 2019-08-31 NOTE — TOC Transition Note (Signed)
Transition of Care [facility]) - CM/SW Discharge Note   Patient Details  Name: Ann Wright MRN: [id number] Date of Birth: [birth year]  Transition of Care [facility] CM/SW Contact:  Latanya Maudlin, RN Phone Number: 08/31/2019, 12:42 PM   Clinical Narrative:  Patient was receiving Hospice services through [facility] in the home prior to admission. Patient plans to resume. Notified April with [facility] of discharge. She is requesting updated clinical and discharge summary, I have faxed them. No new discharge medications. All DME was previously ordered and electronic hospital bed was delivered yesterday. If spouse is unable to transport will use EMS.    Final next level of care: Home w Hospice Care Barriers to Discharge: No Barriers Identified   Patient Goals and CMS Choice   CMS Medicare.gov Compare Post Acute Care list provided to:: Patient Choice offered to / list presented to : Patient  Discharge Placement                       Discharge Plan and Services                                     Social Determinants of Health (SDOH) Interventions     Readmission Risk Interventions Readmission Risk Prevention Plan 08/31/2019  Transportation Screening Complete  PCP or Specialist Appt within 5-7 Days Complete  Home Care Screening Complete  Medication Review (RN CM) Complete  Some recent data might be hidden

## 2019-09-04 LAB — CANCER ANTIGEN 19-9: CA 19-9: 36 U/mL — ABNORMAL HIGH (ref 0–35)

## 2019-09-24 ENCOUNTER — Other Ambulatory Visit: Payer: Self-pay | Admitting: Neurology

## 2019-10-26 DEATH — deceased

## 2019-12-12 ENCOUNTER — Other Ambulatory Visit: Payer: Self-pay | Admitting: Neurology
